# Patient Record
Sex: Male | Born: 1975
Health system: Southern US, Community
[De-identification: ages and names within clinical notes are randomized; demographics above are authoritative.]

## PROBLEM LIST (undated history)

## (undated) DIAGNOSIS — G4730 Sleep apnea, unspecified: Secondary | ICD-10-CM

## (undated) DIAGNOSIS — R0902 Hypoxemia: Secondary | ICD-10-CM

## (undated) DIAGNOSIS — I10 Essential (primary) hypertension: Secondary | ICD-10-CM

## (undated) HISTORY — DX: Essential (primary) hypertension: I10

## (undated) HISTORY — PX: KNEE SURGERY: SHX244

## (undated) HISTORY — DX: Hypoxemia: R09.02

## (undated) HISTORY — DX: Sleep apnea, unspecified: G47.30

## (undated) HISTORY — PX: HERNIA REPAIR: SHX51

---

## 2012-11-30 DIAGNOSIS — E291 Testicular hypofunction: Secondary | ICD-10-CM | POA: Insufficient documentation

## 2012-11-30 DIAGNOSIS — Z79899 Other long term (current) drug therapy: Secondary | ICD-10-CM | POA: Insufficient documentation

## 2013-04-05 DIAGNOSIS — T185XXA Foreign body in anus and rectum, initial encounter: Secondary | ICD-10-CM | POA: Insufficient documentation

## 2013-04-12 DIAGNOSIS — L0390 Cellulitis, unspecified: Secondary | ICD-10-CM | POA: Insufficient documentation

## 2014-05-29 ENCOUNTER — Ambulatory Visit: Payer: Self-pay

## 2014-05-29 ENCOUNTER — Ambulatory Visit: Payer: Self-pay | Admitting: Physician Assistant

## 2014-05-29 LAB — COMPREHENSIVE METABOLIC PANEL
ANION GAP: 9 (ref 7–16)
AST: 37 U/L (ref 15–37)
Albumin: 3 g/dL — ABNORMAL LOW (ref 3.4–5.0)
Alkaline Phosphatase: 71 U/L
BUN: 11 mg/dL (ref 7–18)
Bilirubin,Total: 0.3 mg/dL (ref 0.2–1.0)
CO2: 27 mmol/L (ref 21–32)
Calcium, Total: 8.8 mg/dL (ref 8.5–10.1)
Chloride: 103 mmol/L (ref 98–107)
Creatinine: 0.83 mg/dL (ref 0.60–1.30)
EGFR (African American): >60
EGFR (Non-African Amer.): >60
GLUCOSE: 109 mg/dL — AB (ref 65–99)
OSMOLALITY: 278 (ref 275–301)
POTASSIUM: 4 mmol/L (ref 3.5–5.1)
SGPT (ALT): 54 U/L
Sodium: 139 mmol/L (ref 136–145)
Total Protein: 8.2 g/dL (ref 6.4–8.2)

## 2014-05-29 LAB — CBC WITH DIFFERENTIAL/PLATELET
BASOS ABS: 0.1 10*3/uL (ref 0.0–0.1)
BASOS PCT: 1 %
EOS ABS: 0.2 10*3/uL (ref 0.0–0.7)
EOS PCT: 1.8 %
HCT: 38.4 % — ABNORMAL LOW (ref 40.0–52.0)
HGB: 13.1 g/dL (ref 13.0–18.0)
Lymphocyte #: 2.1 10*3/uL (ref 1.0–3.6)
Lymphocyte %: 20.8 %
MCH: 31.6 pg (ref 26.0–34.0)
MCHC: 34 g/dL (ref 32.0–36.0)
MCV: 93 fL (ref 80–100)
MONO ABS: 0.7 x10 3/mm (ref 0.2–1.0)
Monocyte %: 7.1 %
Neutrophil #: 6.9 10*3/uL — ABNORMAL HIGH (ref 1.4–6.5)
Neutrophil %: 69.3 %
PLATELETS: 202 10*3/uL (ref 150–440)
RBC: 4.13 10*6/uL — AB (ref 4.40–5.90)
RDW: 14 % (ref 11.5–14.5)
WBC: 10 10*3/uL (ref 3.8–10.6)

## 2014-05-29 LAB — D-DIMER(ARMC): D-Dimer: 644 ng/ml

## 2014-06-22 ENCOUNTER — Ambulatory Visit: Payer: Self-pay

## 2014-10-19 ENCOUNTER — Ambulatory Visit: Payer: Self-pay | Admitting: Oncology

## 2014-10-19 LAB — IRON AND TIBC
IRON BIND. CAP.(TOTAL): 265 ug/dL (ref 250–450)
IRON SATURATION: 38 %
IRON: 101 ug/dL (ref 65–175)
UNBOUND IRON-BIND. CAP.: 164 ug/dL

## 2014-10-19 LAB — CBC CANCER CENTER
BASOS ABS: 0.1 x10 3/mm (ref 0.0–0.1)
Basophil %: 0.9 %
Eosinophil #: 0.1 x10 3/mm (ref 0.0–0.7)
Eosinophil %: 1.2 %
HCT: 50.6 % (ref 40.0–52.0)
HGB: 17 g/dL (ref 13.0–18.0)
LYMPHS ABS: 2.6 x10 3/mm (ref 1.0–3.6)
Lymphocyte %: 27.3 %
MCH: 31.4 pg (ref 26.0–34.0)
MCHC: 33.5 g/dL (ref 32.0–36.0)
MCV: 94 fL (ref 80–100)
Monocyte #: 0.6 x10 3/mm (ref 0.2–1.0)
Monocyte %: 6.2 %
Neutrophil #: 6.1 x10 3/mm (ref 1.4–6.5)
Neutrophil %: 64.4 %
PLATELETS: 201 x10 3/mm (ref 150–440)
RBC: 5.4 10*6/uL (ref 4.40–5.90)
RDW: 14.7 % — AB (ref 11.5–14.5)
WBC: 9.5 x10 3/mm (ref 3.8–10.6)

## 2014-10-19 LAB — FERRITIN: Ferritin (ARMC): 337 ng/mL (ref 8–388)

## 2014-11-14 ENCOUNTER — Ambulatory Visit
Admit: 2014-11-14 | Disposition: A | Discharge status: Home / Self Care | Payer: Self-pay | Attending: Oncology | Admitting: Oncology

## 2014-12-15 ENCOUNTER — Ambulatory Visit
Admit: 2014-12-15 | Disposition: A | Discharge status: Home / Self Care | Payer: Self-pay | Attending: Oncology | Admitting: Oncology

## 2015-01-25 ENCOUNTER — Other Ambulatory Visit: Payer: Self-pay

## 2015-01-26 ENCOUNTER — Other Ambulatory Visit: Payer: Self-pay | Admitting: Oncology

## 2015-01-26 ENCOUNTER — Other Ambulatory Visit: Payer: Self-pay

## 2015-01-26 DIAGNOSIS — D751 Secondary polycythemia: Secondary | ICD-10-CM | POA: Insufficient documentation

## 2015-02-22 ENCOUNTER — Ambulatory Visit: Payer: Self-pay | Admitting: Oncology

## 2015-02-22 ENCOUNTER — Inpatient Hospital Stay: Payer: BLUE CROSS/BLUE SHIELD | Attending: Oncology

## 2015-02-22 ENCOUNTER — Other Ambulatory Visit: Payer: BLUE CROSS/BLUE SHIELD

## 2015-03-26 ENCOUNTER — Inpatient Hospital Stay: Payer: BLUE CROSS/BLUE SHIELD | Admitting: Oncology

## 2015-03-26 ENCOUNTER — Inpatient Hospital Stay: Payer: BLUE CROSS/BLUE SHIELD

## 2015-03-26 ENCOUNTER — Inpatient Hospital Stay: Payer: BLUE CROSS/BLUE SHIELD | Attending: Oncology

## 2015-04-11 ENCOUNTER — Inpatient Hospital Stay: Payer: BLUE CROSS/BLUE SHIELD | Admitting: Oncology

## 2015-04-11 ENCOUNTER — Inpatient Hospital Stay: Payer: BLUE CROSS/BLUE SHIELD

## 2015-04-24 ENCOUNTER — Inpatient Hospital Stay: Payer: BLUE CROSS/BLUE SHIELD | Attending: Oncology

## 2015-04-24 ENCOUNTER — Inpatient Hospital Stay: Payer: BLUE CROSS/BLUE SHIELD | Admitting: Oncology

## 2015-04-24 ENCOUNTER — Inpatient Hospital Stay: Payer: BLUE CROSS/BLUE SHIELD

## 2018-03-10 DIAGNOSIS — H5213 Myopia, bilateral: Secondary | ICD-10-CM | POA: Diagnosis not present

## 2018-12-02 DIAGNOSIS — E291 Testicular hypofunction: Secondary | ICD-10-CM | POA: Diagnosis not present

## 2018-12-02 DIAGNOSIS — E785 Hyperlipidemia, unspecified: Secondary | ICD-10-CM | POA: Diagnosis not present

## 2018-12-02 DIAGNOSIS — E039 Hypothyroidism, unspecified: Secondary | ICD-10-CM | POA: Diagnosis not present

## 2018-12-02 DIAGNOSIS — I10 Essential (primary) hypertension: Secondary | ICD-10-CM | POA: Diagnosis not present

## 2018-12-08 DIAGNOSIS — Z23 Encounter for immunization: Secondary | ICD-10-CM | POA: Diagnosis not present

## 2018-12-08 DIAGNOSIS — Z0000 Encounter for general adult medical examination without abnormal findings: Secondary | ICD-10-CM | POA: Diagnosis not present

## 2018-12-08 DIAGNOSIS — E291 Testicular hypofunction: Secondary | ICD-10-CM | POA: Diagnosis not present

## 2018-12-08 DIAGNOSIS — G4730 Sleep apnea, unspecified: Secondary | ICD-10-CM | POA: Diagnosis not present

## 2018-12-08 DIAGNOSIS — I10 Essential (primary) hypertension: Secondary | ICD-10-CM | POA: Diagnosis not present

## 2019-01-14 DIAGNOSIS — E349 Endocrine disorder, unspecified: Secondary | ICD-10-CM | POA: Diagnosis not present

## 2019-01-14 DIAGNOSIS — E785 Hyperlipidemia, unspecified: Secondary | ICD-10-CM | POA: Diagnosis not present

## 2019-01-14 DIAGNOSIS — I10 Essential (primary) hypertension: Secondary | ICD-10-CM | POA: Diagnosis not present

## 2019-01-14 DIAGNOSIS — Z125 Encounter for screening for malignant neoplasm of prostate: Secondary | ICD-10-CM | POA: Diagnosis not present

## 2019-01-14 DIAGNOSIS — D582 Other hemoglobinopathies: Secondary | ICD-10-CM | POA: Diagnosis not present

## 2019-01-14 DIAGNOSIS — R0600 Dyspnea, unspecified: Secondary | ICD-10-CM | POA: Diagnosis not present

## 2019-01-14 DIAGNOSIS — Z8639 Personal history of other endocrine, nutritional and metabolic disease: Secondary | ICD-10-CM | POA: Diagnosis not present

## 2019-01-14 DIAGNOSIS — R600 Localized edema: Secondary | ICD-10-CM | POA: Diagnosis not present

## 2019-01-14 DIAGNOSIS — G4730 Sleep apnea, unspecified: Secondary | ICD-10-CM | POA: Diagnosis not present

## 2019-02-02 DIAGNOSIS — I10 Essential (primary) hypertension: Secondary | ICD-10-CM | POA: Diagnosis not present

## 2019-02-02 DIAGNOSIS — G4730 Sleep apnea, unspecified: Secondary | ICD-10-CM | POA: Diagnosis not present

## 2019-02-02 DIAGNOSIS — E785 Hyperlipidemia, unspecified: Secondary | ICD-10-CM | POA: Diagnosis not present

## 2019-02-16 ENCOUNTER — Other Ambulatory Visit: Payer: Self-pay | Admitting: Internal Medicine

## 2019-02-16 DIAGNOSIS — R600 Localized edema: Secondary | ICD-10-CM

## 2019-02-16 DIAGNOSIS — R0609 Other forms of dyspnea: Secondary | ICD-10-CM

## 2019-02-24 ENCOUNTER — Other Ambulatory Visit: Payer: Self-pay

## 2019-02-24 ENCOUNTER — Ambulatory Visit
Admission: RE | Admit: 2019-02-24 | Discharge: 2019-02-24 | Disposition: A | Discharge status: Home / Self Care | Payer: BC Managed Care – PPO | Source: Ambulatory Visit | Attending: Internal Medicine | Admitting: Internal Medicine

## 2019-02-24 DIAGNOSIS — R600 Localized edema: Secondary | ICD-10-CM | POA: Diagnosis not present

## 2019-02-24 DIAGNOSIS — R0600 Dyspnea, unspecified: Secondary | ICD-10-CM | POA: Diagnosis not present

## 2019-02-24 DIAGNOSIS — R0609 Other forms of dyspnea: Secondary | ICD-10-CM

## 2019-02-24 DIAGNOSIS — I119 Hypertensive heart disease without heart failure: Secondary | ICD-10-CM | POA: Diagnosis not present

## 2019-02-24 DIAGNOSIS — G4733 Obstructive sleep apnea (adult) (pediatric): Secondary | ICD-10-CM | POA: Diagnosis not present

## 2019-02-24 DIAGNOSIS — D582 Other hemoglobinopathies: Secondary | ICD-10-CM | POA: Insufficient documentation

## 2019-02-24 NOTE — Progress Notes (Signed)
*  PRELIMINARY RESULTS* Echocardiogram 2D Echocardiogram has been performed.  [name] Walker 02/24/2019, 10:40 AM

## 2019-02-25 ENCOUNTER — Other Ambulatory Visit: Payer: Self-pay | Admitting: Physician Assistant

## 2019-02-25 ENCOUNTER — Other Ambulatory Visit (HOSPITAL_COMMUNITY): Payer: Self-pay | Admitting: Physician Assistant

## 2019-02-25 ENCOUNTER — Ambulatory Visit
Admission: RE | Admit: 2019-02-25 | Discharge: 2019-02-25 | Disposition: A | Discharge status: Home / Self Care | Payer: BC Managed Care – PPO | Source: Ambulatory Visit | Attending: Physician Assistant | Admitting: Physician Assistant

## 2019-02-25 DIAGNOSIS — I872 Venous insufficiency (chronic) (peripheral): Secondary | ICD-10-CM | POA: Diagnosis not present

## 2019-02-25 DIAGNOSIS — R600 Localized edema: Secondary | ICD-10-CM | POA: Diagnosis not present

## 2019-02-25 DIAGNOSIS — L03115 Cellulitis of right lower limb: Secondary | ICD-10-CM | POA: Diagnosis not present

## 2019-02-25 DIAGNOSIS — M7989 Other specified soft tissue disorders: Secondary | ICD-10-CM | POA: Insufficient documentation

## 2019-02-28 ENCOUNTER — Telehealth (INDEPENDENT_AMBULATORY_CARE_PROVIDER_SITE_OTHER): Payer: Self-pay

## 2019-02-28 DIAGNOSIS — L03115 Cellulitis of right lower limb: Secondary | ICD-10-CM | POA: Diagnosis not present

## 2019-02-28 NOTE — Telephone Encounter (Signed)
[name] Walker (P.A.) had called having a question about the patient care.The patient has cellulitis and is on antibiotics with unna boots being placed and the patient right calf skin is sloughing.[name] Walker (P.A) asked if we did debridement or wound care in the office if not he will refer the patient to wound center.I informed him we do not do debridement in the office if so it will be done in the hospital,so the patient will be referred to wound center but will be keeping appointment with Dr Lucky Cowboy on 03/04/2019.

## 2019-03-02 ENCOUNTER — Encounter: Payer: BC Managed Care – PPO | Attending: Internal Medicine | Admitting: Internal Medicine

## 2019-03-02 ENCOUNTER — Other Ambulatory Visit: Payer: Self-pay

## 2019-03-02 DIAGNOSIS — Z809 Family history of malignant neoplasm, unspecified: Secondary | ICD-10-CM | POA: Insufficient documentation

## 2019-03-02 DIAGNOSIS — I87311 Chronic venous hypertension (idiopathic) with ulcer of right lower extremity: Secondary | ICD-10-CM | POA: Diagnosis not present

## 2019-03-02 DIAGNOSIS — I890 Lymphedema, not elsewhere classified: Secondary | ICD-10-CM | POA: Insufficient documentation

## 2019-03-02 DIAGNOSIS — I872 Venous insufficiency (chronic) (peripheral): Secondary | ICD-10-CM | POA: Insufficient documentation

## 2019-03-02 DIAGNOSIS — Z8249 Family history of ischemic heart disease and other diseases of the circulatory system: Secondary | ICD-10-CM | POA: Insufficient documentation

## 2019-03-02 DIAGNOSIS — Z8349 Family history of other endocrine, nutritional and metabolic diseases: Secondary | ICD-10-CM | POA: Diagnosis not present

## 2019-03-02 DIAGNOSIS — L03115 Cellulitis of right lower limb: Secondary | ICD-10-CM | POA: Diagnosis not present

## 2019-03-02 DIAGNOSIS — L97211 Non-pressure chronic ulcer of right calf limited to breakdown of skin: Secondary | ICD-10-CM | POA: Insufficient documentation

## 2019-03-02 DIAGNOSIS — S81801A Unspecified open wound, right lower leg, initial encounter: Secondary | ICD-10-CM | POA: Diagnosis not present

## 2019-03-02 DIAGNOSIS — G4733 Obstructive sleep apnea (adult) (pediatric): Secondary | ICD-10-CM | POA: Diagnosis not present

## 2019-03-02 DIAGNOSIS — Z880 Allergy status to penicillin: Secondary | ICD-10-CM | POA: Insufficient documentation

## 2019-03-02 DIAGNOSIS — E349 Endocrine disorder, unspecified: Secondary | ICD-10-CM | POA: Diagnosis not present

## 2019-03-02 DIAGNOSIS — I10 Essential (primary) hypertension: Secondary | ICD-10-CM | POA: Diagnosis not present

## 2019-03-02 DIAGNOSIS — Z881 Allergy status to other antibiotic agents status: Secondary | ICD-10-CM | POA: Insufficient documentation

## 2019-03-02 DIAGNOSIS — Z6841 Body Mass Index (BMI) 40.0 and over, adult: Secondary | ICD-10-CM | POA: Insufficient documentation

## 2019-03-03 ENCOUNTER — Other Ambulatory Visit
Admission: RE | Admit: 2019-03-03 | Discharge: 2019-03-03 | Disposition: A | Discharge status: Home / Self Care | Payer: BC Managed Care – PPO | Source: Ambulatory Visit | Attending: Internal Medicine | Admitting: Internal Medicine

## 2019-03-03 DIAGNOSIS — L97211 Non-pressure chronic ulcer of right calf limited to breakdown of skin: Secondary | ICD-10-CM | POA: Diagnosis not present

## 2019-03-03 DIAGNOSIS — L03115 Cellulitis of right lower limb: Secondary | ICD-10-CM | POA: Insufficient documentation

## 2019-03-03 NOTE — Progress Notes (Signed)
[name] Walker, Akiel ([id number]) Visit Report for 03/02/2019 Abuse/Suicide Risk Screen Details Patient Name: [name] Walker, [name] Walker Date of Service: 03/02/2019 2:00 PM Medical Record Number: [id number] Patient Account Number: [id number] Date of Birth/Sex: July 11, 1976 (43 y.o. M) Treating RN: Arnette NorrisBiell, Kristina Primary Care [name] Walker: Daniel NonesKLEIN, BERT Other Clinician: Referring Cashe Gatt: Ignacia Bayleyumey, Robert Treating Corneisha Alvi/Extender: Altamese CarolinaOBSON, MICHAEL G Weeks in Treatment: 0 Abuse/Suicide Risk Screen Items Answer ABUSE RISK SCREEN: Has anyone close to you tried to hurt or harm you recentlyo No Do you feel uncomfortable with anyone in your familyo No Has anyone forced you do things that you didnot want to doo No Electronic Signature(s) Signed: 03/02/2019 4:39:36 PM By: Arnette NorrisBiell, Kristina Entered By: Arnette NorrisBiell, Kristina on 03/02/2019 14:17:15 Moccio, Jashawn ([id number]) -------------------------------------------------------------------------------- Activities of Daily Living Details Patient Name: [name] Walker, [name] Walker Date of Service: 03/02/2019 2:00 PM Medical Record Number: [id number] Patient Account Number: [id number] Date of Birth/Sex: July 11, 1976 ([age] y.o. M) Treating RN: Arnette NorrisBiell, Kristina Primary Care Sparsh Callens: Daniel NonesKLEIN, BERT Other Clinician: Referring Romyn Boswell: Ignacia Bayleyumey, Robert Treating Biagio Snelson/Extender: Altamese CarolinaOBSON, MICHAEL G Weeks in Treatment: 0 Activities of Daily Living Items Answer Activities of Daily Living (Please select one for each item) Drive Automobile Completely Able Take Medications Completely Able Use Telephone Completely Able Care for Appearance Completely Able Use Toilet Completely Able Bath / Shower Completely Able Dress Self Completely Able Feed Self Completely Able Walk Completely Able Get In / Out Bed Completely Able Housework Completely Able Prepare Meals Completely Able Handle Money Completely Able Shop for Self Completely Able Electronic Signature(s) Signed: 03/02/2019 4:39:36 PM By: Arnette NorrisBiell, Kristina Entered By:  Arnette NorrisBiell, Kristina on 03/02/2019 14:17:28 Knauer, Blakeley ([id number]) -------------------------------------------------------------------------------- Education Screening Details Patient Name: [name] Walker, [name] Walker Date of Service: 03/02/2019 2:00 PM Medical Record Number: [id number] Patient Account Number: [id number] Date of Birth/Sex: July 11, 1976 (43 y.o. M) Treating RN: Arnette NorrisBiell, Kristina Primary Care Sarahbeth Cashin: Daniel NonesKLEIN, BERT Other Clinician: Referring Aleen Marston: Ignacia Bayleyumey, Robert Treating Nicoles Sedlacek/Extender: Altamese CarolinaOBSON, MICHAEL G Weeks in Treatment: 0 Primary Learner Assessed: Patient Learning Preferences/Education Level/Primary Language Learning Preference: Explanation Highest Education Level: High School Preferred Language: English Cognitive Barrier Language Barrier: No Translator Needed: No Memory Deficit: No Emotional Barrier: No Cultural/Religious Beliefs Affecting Medical Care: No Physical Barrier Impaired Vision: No Impaired Hearing: No Decreased Hand dexterity: No Knowledge/Comprehension Knowledge Level: High Comprehension Level: High Ability to understand written High instructions: Ability to understand verbal High instructions: Motivation Anxiety Level: Calm Cooperation: Cooperative Education Importance: Acknowledges Need Interest in Health Problems: Asks Questions Perception: Coherent Willingness to Engage in Self- High Management Activities: Readiness to Engage in Self- High Management Activities: Electronic Signature(s) Signed: 03/02/2019 4:39:36 PM By: Arnette NorrisBiell, Kristina Entered By: Arnette NorrisBiell, Kristina on 03/02/2019 14:17:54 Fullen, Rodgers ([id number]) -------------------------------------------------------------------------------- Fall Risk Assessment Details Patient Name: [name] Walker, [name] Walker Date of Service: 03/02/2019 2:00 PM Medical Record Number: [id number] Patient Account Number: [id number] Date of Birth/Sex: July 11, 1976 (43 y.o. M) Treating RN: Arnette NorrisBiell, Kristina Primary Care Ocean Schildt:  Daniel NonesKLEIN, BERT Other Clinician: Referring Reyn Faivre: Ignacia Bayleyumey, Robert Treating Gianny Killman/Extender: Altamese CarolinaOBSON, MICHAEL G Weeks in Treatment: 0 Fall Risk Assessment Items Have you had 2 or more falls in the last 12 monthso 0 Yes Have you had any fall that resulted in injury in the last 12 monthso 0 Yes FALLS RISK SCREEN History of falling - immediate or within 3 months 25 Yes Secondary diagnosis (Do you have 2 or more medical diagnoseso) 0 No Ambulatory aid None/bed rest/wheelchair/nurse 0 No Crutches/cane/walker 0 No Furniture 0 No Intravenous therapy Access/Saline/Heparin Lock 0 No Gait/Transferring Normal/ bed rest/ wheelchair 0 No Weak (short steps with or without shuffle, stooped but able  to lift head while 0 No walking, may seek support from furniture) Impaired (short steps with shuffle, may have difficulty arising from chair, head 0 No down, impaired balance) Mental Status Oriented to own ability 0 Yes Electronic Signature(s) Signed: 03/02/2019 4:39:36 PM By: Harold Barban Entered By: Harold Barban on 03/02/2019 14:18:29 Solinger, Shiheem ([id number]) -------------------------------------------------------------------------------- Foot Assessment Details Patient Name: [name] Walker, [name] Walker Date of Service: 03/02/2019 2:00 PM Medical Record Number: [id number] Patient Account Number: [id number] Date of Birth/Sex: [birth year] (43 y.o. M) Treating RN: Harold Barban Primary Care Leaann Nevils: Ramonita Lab Other Clinician: Referring Jorah Hua: Mortimer Fries Treating Aliene Tamura/Extender: Tito Dine in Treatment: 0 Foot Assessment Items Site Locations + = Sensation present, - = Sensation absent, C = Callus, U = Ulcer R = Redness, W = Warmth, M = Maceration, PU = Pre-ulcerative lesion F = Fissure, S = Swelling, D = Dryness Assessment Right: Left: Other Deformity: No No Prior Foot Ulcer: No No Prior Amputation: No No Charcot Joint: No No Ambulatory Status: Ambulatory Without Help Gait:  Steady Electronic Signature(s) Signed: 03/02/2019 4:39:36 PM By: Harold Barban Entered By: Harold Barban on 03/02/2019 14:21:34 Good [place], Kortland ([id number]) -------------------------------------------------------------------------------- Nutrition Risk Screening Details Patient Name: [name] Walker, [name] Walker Date of Service: 03/02/2019 2:00 PM Medical Record Number: [id number] Patient Account Number: [id number] Date of Birth/Sex: 30-May-1976 (43 y.o. M) Treating RN: Harold Barban Primary Care Ainsley Sanguinetti: Ramonita Lab Other Clinician: Referring Tobe Kervin: Mortimer Fries Treating Keyaira Clapham/Extender: Tito Dine in Treatment: 0 Height (in): 66 Weight (lbs): 442 Body Mass Index (BMI): 71.3 Nutrition Risk Screening Items Score Screening NUTRITION RISK SCREEN: I have an illness or condition that made me change the kind and/or amount of 0 No food I eat I eat fewer than two meals per day 0 No I eat few fruits and vegetables, or milk products 0 No I have three or more drinks of beer, liquor or wine almost every day 0 No I have tooth or mouth problems that make it hard for me to eat 0 No I don't always have enough money to buy the food I need 0 No I eat alone most of the time 0 No I take three or more different prescribed or over-the-counter drugs a day 1 Yes Without wanting to, I have lost or gained 10 pounds in the last six months 0 No I am not always physically able to shop, cook and/or feed myself 0 No Nutrition Protocols Good Risk Protocol Moderate Risk Protocol High Risk Proctocol Risk Level: Good Risk Score: 1 Electronic Signature(s) Signed: 03/02/2019 4:39:36 PM By: Harold Barban Entered By: Harold Barban on 03/02/2019 14:18:49

## 2019-03-03 NOTE — Progress Notes (Addendum)
[name] Walker, Orlyn ([id number]) Visit Report for 03/02/2019 Chief Complaint Document Details Patient Name: [name] Walker, Berl Date of Service: 03/02/2019 2:00 PM Medical Record Number: [id number] Patient Account Number: [id number] Date of Birth/Sex: Jan 24, 1976 (43 y.o. M) Treating RN: Huel CoventryWoody, Kim Primary Care Provider: Daniel NonesKLEIN, BERT Other Clinician: Referring Provider: Daniel NonesKLEIN, BERT Treating Provider/Extender: Altamese CarolinaOBSON, Ordell Prichett G Weeks in Treatment: 0 Information Obtained from: Patient Chief Complaint 03/02/2019; patient arrives here for review of a wound on the posterior right calf in the setting of severe cellulitis of the right leg Electronic Signature(s) Signed: 03/02/2019 6:03:55 PM By: Baltazar Najjarobson, Jaun Galluzzo MD Entered By: Baltazar Najjarobson, Omari Koslosky on 03/02/2019 15:36:09 [place], Aquila ([id number]) -------------------------------------------------------------------------------- Debridement Details Patient Name: [name] Walker, [name] Walker Date of Service: 03/02/2019 2:00 PM Medical Record Number: [id number] Patient Account Number: [id number] Date of Birth/Sex: Jan 24, 1976 (43 y.o. M) Treating RN: Huel CoventryWoody, Kim Primary Care Provider: Daniel NonesKLEIN, BERT Other Clinician: Referring Provider: Daniel NonesKLEIN, BERT Treating Provider/Extender: Altamese CarolinaOBSON, Chelsea Pedretti G Weeks in Treatment: 0 Debridement Performed for Wound #1 Right,Posterior Lower Leg Assessment: Performed By: Physician Maxwell CaulOBSON, Ulysses Alper G, MD Debridement Type: Chemical/Enzymatic/Mechanical Agent Used: Gauze and saline Severity of Tissue Pre Fat layer exposed Debridement: Level of Consciousness (Pre- Awake and Alert procedure): Pre-procedure Verification/Time Yes - 15:00 Out Taken: Start Time: 15:00 Pain Control: Lidocaine Instrument: Other : Saline and gauze Bleeding: Minimum Hemostasis Achieved: Pressure End Time: 15:05 Response to Treatment: Procedure was tolerated well Level of Consciousness Awake and Alert (Post-procedure): Post Debridement Measurements of Total Wound Length: (cm)  19 Width: (cm) 24 Depth: (cm) 0.1 Volume: (cm) 35.814 Character of Wound/Ulcer Post Debridement: Stable Severity of Tissue Post Debridement: Limited to breakdown of skin Post Procedure Diagnosis Same as Pre-procedure Electronic Signature(s) Signed: 03/02/2019 6:03:55 PM By: Baltazar Najjarobson, Bryanah Sidell MD Signed: 03/03/2019 4:58:07 PM By: Elliot GurneyWoody, BSN, RN, CWS, Kim RN, BSN Entered By: Baltazar Najjarobson, Abdulahi Schor on 03/02/2019 15:35:46 Loy, Sayge ([id number]) -------------------------------------------------------------------------------- HPI Details Patient Name: [name] Walker, [name] Walker Date of Service: 03/02/2019 2:00 PM Medical Record Number: [id number] Patient Account Number: [id number] Date of Birth/Sex: Jan 24, 1976 (43 y.o. M) Treating RN: Huel CoventryWoody, Kim Primary Care Provider: Daniel NonesKLEIN, BERT Other Clinician: Referring Provider: Daniel NonesKLEIN, BERT Treating Provider/Extender: Altamese CarolinaOBSON, Nohemi Nicklaus G Weeks in Treatment: 0 History of Present Illness HPI Description: ADMISSION 03/02/2019 This is a 43 year old man who is not a diabetic. He states he was well up until 8 days ago when he felt fatigued and generally unwell while at work. He went home and discovered intense erythema in the right leg. This was painful. He did not make it into see his doctor until 3 days later. He was put on doxycycline. He had a duplex ultrasound done at interventional radiology that did not show reflux, DVT or superficial thrombophlebitis. He was put on doxycycline which she is still taking. He was put in an Radio broadcast assistantUnna boot. He was reviewed again 2 days ago and referred here. He had an Radio broadcast assistantUnna boot on when he came in here today. Patient states he is not systemically unwell but the leg is uncomfortable. He does not have a history of wounds on his legs. He does have some discoloration of the left leg which looks like chronic venous changes however he states that the right leg did not have this prior to any changes recently. I note that he has been seen by Dr. Graciela HusbandsKlein at the  [facility] worked up for lower extremity edema. He had an echocardiogram. Past medical history includes hypertension, edema of both legs, obstructive sleep apnea, morbid obesity. Allergies, he is listed as being allergic to amoxicillin from 2015 and  his primary doctor's office although I do not see this in epic. The patient does not remember anything about this. He does not remember being on amoxicillin. Socially; he works in a Glass blower/designer. But he denies getting his leg into contact with anything particularly problematic ABIs in our clinic were noncompressible bilaterally Electronic Signature(s) Signed: 03/02/2019 6:03:55 PM By: Linton Ham MD Entered By: Linton Ham on 03/02/2019 15:39:44 Cavan, Dajohn ([id number]) -------------------------------------------------------------------------------- Physical Exam Details Patient Name: [name] Walker, [name] Walker Date of Service: 03/02/2019 2:00 PM Medical Record Number: [id number] Patient Account Number: [id number] Date of Birth/Sex: November 04, 1975 (43 y.o. M) Treating RN: Cornell Barman Primary Care Provider: Ramonita Lab Other Clinician: Referring Provider: Ramonita Lab Treating Provider/Extender: Ricard Dillon Weeks in Treatment: 0 Constitutional Patient is hypertensive.. Pulse regular and within target range for patient.Marland Kitchen Respirations regular, non-labored and within target range.. Temperature is normal and within the target range for the patient.Marland Kitchen appears in no distress. Morbid obesity. Eyes Conjunctivae clear. No discharge. Respiratory Respiratory effort is easy and symmetric bilaterally. Rate is normal at rest and on room air.. Bilateral breath sounds are clear and equal in all lobes with no wheezes, rales or rhonchi.. Cardiovascular Heart rhythm and rate regular, without murmur or gallop.Marland Kitchen Lymphatic None palpable in the popliteal area. Integumentary (Hair, Skin) There is some changes of chronic venous insufficiency in the left  leg. Psychiatric No evidence of depression, anxiety, or agitation. Calm, cooperative, and communicative. Appropriate interactions and affect.. Notes Wound exam; area questions on a large area of his posterior right calf. There is intense superficial tissue destruction with necrotic peeling skin on the large area of this wound. I thought there was a blister inferiorly and using scissors I attempted to open that to get a culture this turned out to be more necrotic tissue however I did culture this area. The rest of the leg is intensely erythematous and very painful. The extent of this was concerning. Electronic Signature(s) Signed: 03/02/2019 6:03:55 PM By: Linton Ham MD Entered By: Linton Ham on 03/02/2019 15:41:52 [place], Cree ([id number]) -------------------------------------------------------------------------------- Physician Orders Details Patient Name: [name] Walker, Idrees Date of Service: 03/02/2019 2:00 PM Medical Record Number: [id number] Patient Account Number: [id number] Date of Birth/Sex: [birth year] (43 y.o. M) Treating RN: Cornell Barman Primary Care Provider: Ramonita Lab Other Clinician: Referring Provider: Ramonita Lab Treating Provider/Extender: Tito Dine in Treatment: 0 Verbal / Phone Orders: No Diagnosis Coding Wound Cleansing Wound #1 Right,Posterior Lower Leg o Dial antibacterial soap, wash wounds, rinse and pat dry prior to dressing wounds o May shower with protection. Anesthetic (add to Medication List) Wound #1 Right,Posterior Lower Leg o Topical Lidocaine 4% cream applied to wound bed prior to debridement (In Clinic Only). Primary Wound Dressing Wound #1 Right,Posterior Lower Leg o Silver Alginate Secondary Dressing Wound #1 Right,Posterior Lower Leg o ABD pad Dressing Change Frequency Wound #1 Right,Posterior Lower Leg o Change dressing every day. Follow-up Appointments Wound #1 Right,Posterior Lower Leg o Return Appointment in 1  week. o Return Appointment in: - Friday 04/03/2019 for re-check Edema Control Wound #1 Right,Posterior Lower Leg o Elevate legs to the level of the heart and pump ankles as often as possible Additional Orders / Instructions o Other: - Out of work until the 24th. Will re-evaluate at hat time. Medications-please add to medication list. Wound #1 Right,Posterior Lower Leg o P.O. Antibiotics Laboratory o Bacteria identified in Wound by Culture (MICRO) oooo LOINC Code: 3536-1 Mundt, Jeff ([id number]) oooo Convenience Name: Wound culture routine Patient Medications Allergies: amoxicillin,  Maxzide Notifications Medication Indication Start End linezolid cellulitis right leg 03/02/2019 DOSE oral 600 mg tablet - 1 tablet oral bid for 7 days Notes Redness marked-if redness increases, spreads, pain worsens or fever arises, go immediately to an emergency department. Electronic Signature(s) Signed: 03/07/2019 4:23:08 PM By: Elliot Gurney, BSN, RN, CWS, Kim RN, BSN Signed: 03/09/2019 4:13:17 PM By: Baltazar Najjar MD Previous Signature: 03/02/2019 6:03:55 PM Version By: Baltazar Najjar MD Previous Signature: 03/03/2019 4:58:07 PM Version By: Elliot Gurney BSN, RN, CWS, Kim RN, BSN Previous Signature: 03/02/2019 3:14:23 PM Version By: Baltazar Najjar MD Entered By: Elliot Gurney BSN, RN, CWS, Kim on 03/04/2019 12:49:18 Dishman, Calin ([id number]) -------------------------------------------------------------------------------- Problem List Details Patient Name: RIDEAUX, Nishawn Date of Service: 03/02/2019 2:00 PM Medical Record Number: [id number] Patient Account Number: [id number] Date of Birth/Sex: 1976-09-13 (43 y.o. M) Treating RN: Huel Coventry Primary Care Provider: Daniel Nones Other Clinician: Referring Provider: Daniel Nones Treating Provider/Extender: Altamese [place] in Treatment: 0 Active Problems ICD-10 Evaluated Encounter Code Description Active Date Today Diagnosis L97.211 Non-pressure chronic ulcer  of right calf limited to breakdown 03/02/2019 No Yes of skin L03.115 Cellulitis of right lower limb 03/02/2019 No Yes I87.311 Chronic venous hypertension (idiopathic) with ulcer of right 03/02/2019 No Yes lower extremity Inactive Problems Resolved Problems Electronic Signature(s) Signed: 03/02/2019 6:03:55 PM By: Baltazar Najjar MD Entered By: Baltazar Najjar on 03/02/2019 15:34:01 Kresse, Kier ([id number]) -------------------------------------------------------------------------------- Progress Note Details Patient Name: [name] Repress, Sammy Date of Service: 03/02/2019 2:00 PM Medical Record Number: [id number] Patient Account Number: [id number] Date of Birth/Sex: 12-17-1975 (43 y.o. M) Treating RN: Huel Coventry Primary Care Provider: Daniel Nones Other Clinician: Referring Provider: Daniel Nones Treating Provider/Extender: Altamese [place] in Treatment: 0 Subjective Chief Complaint Information obtained from Patient 03/02/2019; patient arrives here for review of a wound on the posterior right calf in the setting of severe cellulitis of the right leg History of Present Illness (HPI) ADMISSION 03/02/2019 This is a 43 year old man who is not a diabetic. He states he was well up until 8 days ago when he felt fatigued and generally unwell while at work. He went home and discovered intense erythema in the right leg. This was painful. He did not make it into see his doctor until 3 days later. He was put on doxycycline. He had a duplex ultrasound done at interventional radiology that did not show reflux, DVT or superficial thrombophlebitis. He was put on doxycycline which she is still taking. He was put in an Radio broadcast assistant. He was reviewed again 2 days ago and referred here. He had an Radio broadcast assistant on when he came in here today. Patient states he is not systemically unwell but the leg is uncomfortable. He does not have a history of wounds on his legs. He does have some discoloration of the left leg which looks  like chronic venous changes however he states that the right leg did not have this prior to any changes recently. I note that he has been seen by Dr. Graciela Husbands at the [facility] worked up for lower extremity edema. He had an echocardiogram. Past medical history includes hypertension, edema of both legs, obstructive sleep apnea, morbid obesity. Allergies, he is listed as being allergic to amoxicillin from 2015 and his primary doctor's office although I do not see this in epic. The patient does not remember anything about this. He does not remember being on amoxicillin. Socially; he works in a Education officer, community. But he denies getting his leg into contact with anything particularly problematic  ABIs in our clinic were noncompressible bilaterally Patient History Information obtained from Patient. Allergies amoxicillin (Reaction: unknown), Maxzide (Reaction: unknown) Family History Cancer - Maternal Grandparents, Diabetes - Mother,Father, Heart Disease - Father, Hypertension - Father,Mother, Thyroid Problems - Mother, No family history of Hereditary Spherocytosis, Kidney Disease, Lung Disease, Seizures, Stroke, Tuberculosis. Social History Never smoker, Alcohol Use - Never, Drug Use - No History, Caffeine Use - Daily. Medical History Eyes Denies history of Cataracts, Glaucoma, Optic Neuritis Grinage, Jeovany ([id number]) Ear/Nose/Mouth/Throat Denies history of Chronic sinus problems/congestion, Middle ear problems Hematologic/Lymphatic Denies history of Anemia, Hemophilia, Human Immunodeficiency Virus, Lymphedema, Sickle Cell Disease Cardiovascular Patient has history of Hypertension Denies history of Angina, Arrhythmia, Congestive Heart Failure, Coronary Artery Disease, Deep Vein Thrombosis, Hypotension, Myocardial Infarction, Peripheral Arterial Disease, Peripheral Venous Disease, Phlebitis, Vasculitis Gastrointestinal Denies history of Cirrhosis , Colitis, Crohn s, Hepatitis A,  Hepatitis B, Hepatitis C Endocrine Denies history of Type I Diabetes, Type II Diabetes Genitourinary Denies history of End Stage Renal Disease Immunological Denies history of Lupus Erythematosus, Raynaud s, Scleroderma Integumentary (Skin) Denies history of History of Burn, History of pressure wounds Musculoskeletal Denies history of Gout, Rheumatoid Arthritis, Osteoarthritis, Osteomyelitis Oncologic Denies history of Received Chemotherapy, Received Radiation Psychiatric Denies history of Anorexia/bulimia, Confinement Anxiety Medical And Surgical History Notes Respiratory Sleep apnea on CPAP Review of Systems (ROS) Constitutional Symptoms (General Health) Denies complaints or symptoms of Fatigue, Fever, Chills, Marked Weight Change. Eyes Denies complaints or symptoms of Dry Eyes, Vision Changes, Glasses / Contacts. Ear/Nose/Mouth/Throat Denies complaints or symptoms of Difficult clearing ears, Sinusitis. Hematologic/Lymphatic Denies complaints or symptoms of Bleeding / Clotting Disorders, Human Immunodeficiency Virus. Respiratory Denies complaints or symptoms of Chronic or frequent coughs, Shortness of Breath. Cardiovascular Denies complaints or symptoms of Chest pain, LE edema. Gastrointestinal Denies complaints or symptoms of Frequent diarrhea, Nausea, Vomiting. Endocrine Complains or has symptoms of Thyroid disease - Hypothyroidism- past, hypogonadism Genitourinary Denies complaints or symptoms of Kidney failure/ Dialysis, Incontinence/dribbling. Immunological Denies complaints or symptoms of Hives, Itching. Integumentary (Skin) Complains or has symptoms of Breakdown, Swelling. Denies complaints or symptoms of Wounds, Bleeding or bruising tendency. Musculoskeletal Denies complaints or symptoms of Muscle Pain, Muscle Weakness. Psychiatric Denies complaints or symptoms of Anxiety, Claustrophobia. Natzke, Davante ([id number]) Objective Constitutional Patient is  hypertensive.. Pulse regular and within target range for patient.Marland Kitchen. Respirations regular, non-labored and within target range.. Temperature is normal and within the target range for the patient.Marland Kitchen. appears in no distress. Morbid obesity. Vitals Time Taken: 2:00 PM, Height: 66 in, Source: Stated, Weight: 442 lbs, Source: Stated, BMI: 71.3, Temperature: 98.2 F, Pulse: 90 bpm, Respiratory Rate: 18 breaths/min, Blood Pressure: 175/80 mmHg. Eyes Conjunctivae clear. No discharge. Respiratory Respiratory effort is easy and symmetric bilaterally. Rate is normal at rest and on room air.. Bilateral breath sounds are clear and equal in all lobes with no wheezes, rales or rhonchi.. Cardiovascular Heart rhythm and rate regular, without murmur or gallop.Marland Kitchen. Lymphatic None palpable in the popliteal area. Psychiatric No evidence of depression, anxiety, or agitation. Calm, cooperative, and communicative. Appropriate interactions and affect.. General Notes: Wound exam; area questions on a large area of his posterior right calf. There is intense superficial tissue destruction with necrotic peeling skin on the large area of this wound. I thought there was a blister inferiorly and using scissors I attempted to open that to get a culture this turned out to be more necrotic tissue however I did culture this area. The rest of the leg is intensely erythematous and very painful.  The extent of this was concerning. Integumentary (Hair, Skin) There is some changes of chronic venous insufficiency in the left leg. Wound #1 status is Open. Original cause of wound was Gradually Appeared. The wound is located on the Right,Posterior Lower Leg. The wound measures 19cm length x 24cm width x 0.1cm depth; 358.142cm^2 area and 35.814cm^3 volume. There is Fat Layer (Subcutaneous Tissue) Exposed exposed. There is no tunneling or undermining noted. There is a large amount of serous drainage noted. The wound margin is flat and intact.  There is small (1-33%) pale granulation within the wound bed. There is a large (67-100%) amount of necrotic tissue within the wound bed including Adherent Slough. Assessment Active Problems ICD-10 Non-pressure chronic ulcer of right calf limited to breakdown of skin Cellulitis of right lower limb Chronic venous hypertension (idiopathic) with ulcer of right lower extremity Crall, Cinch ([id number]) Procedures Wound #1 Pre-procedure diagnosis of Wound #1 is a Lymphedema located on the Right,Posterior Lower Leg .Severity of Tissue Pre Debridement is: Fat layer exposed. There was a Chemical/Enzymatic/Mechanical debridement performed by Maxwell CaulOBSON, Kemonte Ullman G, MD. With the following instrument(s): Saline and gauze after achieving pain control using Lidocaine. Other agent used was Gauze and saline. A time out was conducted at 15:00, prior to the start of the procedure. A Minimum amount of bleeding was controlled with Pressure. The procedure was tolerated well. Post Debridement Measurements: 19cm length x 24cm width x 0.1cm depth; 35.814cm^3 volume. Character of Wound/Ulcer Post Debridement is stable. Severity of Tissue Post Debridement is: Limited to breakdown of skin. Post procedure Diagnosis Wound #1: Same as Pre-Procedure Plan Wound Cleansing: Wound #1 Right,Posterior Lower Leg: Dial antibacterial soap, wash wounds, rinse and pat dry prior to dressing wounds May shower with protection. Anesthetic (add to Medication List): Wound #1 Right,Posterior Lower Leg: Topical Lidocaine 4% cream applied to wound bed prior to debridement (In Clinic Only). Primary Wound Dressing: Wound #1 Right,Posterior Lower Leg: Silver Alginate Secondary Dressing: Wound #1 Right,Posterior Lower Leg: ABD pad Dressing Change Frequency: Wound #1 Right,Posterior Lower Leg: Change dressing every day. Follow-up Appointments: Wound #1 Right,Posterior Lower Leg: Return Appointment in 1 week. Return Appointment in: -  Friday 04/03/2019 for re-check Edema Control: Wound #1 Right,Posterior Lower Leg: Elevate legs to the level of the heart and pump ankles as often as possible Additional Orders / Instructions: Other: - Out of work until the 24th. Will re-evaluate at hat time. Medications-please add to medication list.: Wound #1 Right,Posterior Lower Leg: P.O. Antibiotics The following medication(s) was prescribed: linezolid oral 600 mg tablet 1 tablet oral bid for 7 days for cellulitis right leg starting 03/02/2019 General Notes: Redness marked-if redness increases, spreads, pain worsens or fever arises, go immediately [place] ([id number]) emergency department. 1. This patient has intense lower extremity cellulitis with fire engine red erythema and tenderness. This went up to the level of his tibial tuberosity anteriorly. I have marked the upper areas of this 2. He is not responding to doxycycline or at least not responding well. It is worthwhile remembering that doxycycline is not the treatment of choice for strep infections and this is likely strep. I have wanted to give him Keflex however he is allergic to amoxicillin although he remembers nothing about this. I ultimately gave him linezolid 600 twice daily for 7 days hopefully this will prove to be affordable. 3. I actually suggested initially that this patient go to the hospital for 48 hours of IV antibiotics given the extent of this. He refused. We told  him that the possibility of spreading infection or sepsis is possible he still wanted to try another round of different oral antibiotics. I have urged him to go to the hospital if he becomes systemically unwell for the erythema that I have marked in his leg worsens. 4. He had a duplex ultrasound that was negative for DVT but truthfully this does not look like a DVT 5. We put silver alginate on the pack of the wound with ABDs Kerlix wrap and Tubigrip. We have asked him to change this daily. We  will see him back on Friday in follow-up to make sure the leg is responding. 6. I have asked him to call his primary doctor to check on this amoxicillin allergy. If he is truly amoxicillin allergic he has about a 7% chance of coexistent cephalosporin allergy. I am actually doubtful he is amoxicillin allergic Electronic Signature(s) Signed: 03/02/2019 6:03:55 PM By: Baltazar Najjar MD Entered By: Baltazar Najjar on 03/02/2019 15:44:50 Crass, Tre ([id number]) -------------------------------------------------------------------------------- ROS/PFSH Details Patient Name: [name] Repress, Adolfo Date of Service: 03/02/2019 2:00 PM Medical Record Number: [id number] Patient Account Number: [id number] Date of Birth/Sex: 07/22/1976 (43 y.o. M) Treating RN: Arnette Norris Primary Care Provider: Daniel Nones Other Clinician: Referring Provider: Daniel Nones Treating Provider/Extender: Altamese [place] in Treatment: 0 Information Obtained From Patient Constitutional Symptoms (General Health) Complaints and Symptoms: Negative for: Fatigue; Fever; Chills; Marked Weight Change Eyes Complaints and Symptoms: Negative for: Dry Eyes; Vision Changes; Glasses / Contacts Medical History: Negative for: Cataracts; Glaucoma; Optic Neuritis Ear/Nose/Mouth/Throat Complaints and Symptoms: Negative for: Difficult clearing ears; Sinusitis Medical History: Negative for: Chronic sinus problems/congestion; Middle ear problems Hematologic/Lymphatic Complaints and Symptoms: Negative for: Bleeding / Clotting Disorders; Human Immunodeficiency Virus Medical History: Negative for: Anemia; Hemophilia; Human Immunodeficiency Virus; Lymphedema; Sickle Cell Disease Respiratory Complaints and Symptoms: Negative for: Chronic or frequent coughs; Shortness of Breath Medical History: Past Medical History Notes: Sleep apnea on CPAP Cardiovascular Complaints and Symptoms: Negative for: Chest pain; LE edema Medical  History: Positive for: Hypertension Negative for: Angina; Arrhythmia; Congestive Heart Failure; Coronary Artery Disease; Deep Vein Thrombosis; Hypotension; Myocardial Infarction; Peripheral Arterial Disease; Peripheral Venous Disease; Phlebitis; Vasculitis Kinzler, Tae ([id number]) Gastrointestinal Complaints and Symptoms: Negative for: Frequent diarrhea; Nausea; Vomiting Medical History: Negative for: Cirrhosis ; Colitis; Crohnos; Hepatitis A; Hepatitis B; Hepatitis C Endocrine Complaints and Symptoms: Positive for: Thyroid disease - Hypothyroidism- past Review of System Notes: hypogonadism Medical History: Negative for: Type I Diabetes; Type II Diabetes Genitourinary Complaints and Symptoms: Negative for: Kidney failure/ Dialysis; Incontinence/dribbling Medical History: Negative for: End Stage Renal Disease Immunological Complaints and Symptoms: Negative for: Hives; Itching Medical History: Negative for: Lupus Erythematosus; Raynaudos; Scleroderma Integumentary (Skin) Complaints and Symptoms: Positive for: Breakdown; Swelling Negative for: Wounds; Bleeding or bruising tendency Medical History: Negative for: History of Burn; History of pressure wounds Musculoskeletal Complaints and Symptoms: Negative for: Muscle Pain; Muscle Weakness Medical History: Negative for: Gout; Rheumatoid Arthritis; Osteoarthritis; Osteomyelitis Psychiatric Complaints and Symptoms: Negative for: Anxiety; Claustrophobia Medical History: Negative for: Anorexia/bulimia; Confinement Anxiety Oncologic Sabino, Dyshawn ([id number]) Medical History: Negative for: Received Chemotherapy; Received Radiation Immunizations Pneumococcal Vaccine: Received Pneumococcal Vaccination: No Implantable Devices None Family and Social History Cancer: Yes - Maternal Grandparents; Diabetes: Yes - Mother,Father; Heart Disease: Yes - Father; Hereditary Spherocytosis: No; Hypertension: Yes - Father,Mother; Kidney  Disease: No; Lung Disease: No; Seizures: No; Stroke: No; Thyroid Problems: Yes - Mother; Tuberculosis: No; Never smoker; Alcohol Use: Never; Drug Use: No History; Caffeine Use: Daily; Financial Concerns: No; Food, Clothing  or Shelter Needs: No; Support System Lacking: No; Transportation Concerns: No Electronic Signature(s) Signed: 03/02/2019 4:39:36 PM By: Arnette Norris Signed: 03/02/2019 6:03:55 PM By: Baltazar Najjar MD Entered By: Arnette Norris on 03/02/2019 14:17:03 [place], Dhiren ([id number]) -------------------------------------------------------------------------------- SuperBill Details Patient Name: [name] Repress, [place] Date of Service: 03/02/2019 Medical Record Number: [id number] Patient Account Number: [id number] Date of Birth/Sex: [birth year] (43 y.o. M) Treating RN: Huel Coventry Primary Care Provider: Daniel Nones Other Clinician: Referring Provider: Daniel Nones Treating Provider/Extender: Maxwell Caul Weeks in Treatment: 0 Diagnosis Coding ICD-10 Codes Code Description 5860925832 Non-pressure chronic ulcer of right calf limited to breakdown of skin L03.115 Cellulitis of right lower limb I87.311 Chronic venous hypertension (idiopathic) with ulcer of right lower extremity Facility Procedures CPT4 Code: 21308657 Description: 99213 - WOUND CARE VISIT-LEV 3 EST PT Modifier: Quantity: 1 CPT4 Code: 84696295 Description: 28413 - DEBRIDE W/O ANES NON SELECT Modifier: Quantity: 1 Physician Procedures CPT4 Code: 2440102 Description: 99204 - WC PHYS LEVEL 4 - NEW PT ICD-10 Diagnosis Description L97.211 Non-pressure chronic ulcer of right calf limited to breakdown L03.115 Cellulitis of right lower limb I87.311 Chronic venous hypertension (idiopathic) with ulcer of right Modifier: of skin lower extremity Quantity: 1 Electronic Signature(s) Signed: 03/02/2019 6:03:55 PM By: Baltazar Najjar MD Entered By: Baltazar Najjar on 03/02/2019 15:45:28

## 2019-03-03 NOTE — Progress Notes (Signed)
[name] Walker, [name] Walker ([id number]) Visit Report for 03/02/2019 Allergy List Details Patient Name: [name] Walker, [name] Walker Date of Service: 03/02/2019 2:00 PM Medical Record Number: [id number] Patient Account Number: [id number] Date of Birth/Sex: 1975-11-25 (43 y.o. M) Treating RN: Arnette NorrisBiell, Kristina Primary Care Terrisha Lopata: Daniel NonesKLEIN, BERT Other Clinician: Referring Needham Biggins: Ignacia Bayleyumey, Robert Treating Zayne Marovich/Extender: Maxwell CaulOBSON, MICHAEL G Weeks in Treatment: 0 Allergies Active Allergies amoxicillin Reaction: unknown Maxzide Reaction: unknown Allergy Notes Electronic Signature(s) Signed: 03/02/2019 4:39:36 PM By: Arnette NorrisBiell, Kristina Entered By: Arnette NorrisBiell, Kristina on 03/02/2019 14:11:27 Flood, Thoams ([id number]) -------------------------------------------------------------------------------- Arrival Information Details Patient Name: [name] Walker, [name] Walker Date of Service: 03/02/2019 2:00 PM Medical Record Number: [id number] Patient Account Number: [id number] Date of Birth/Sex: 1975-11-25 (43 y.o. M) Treating RN: Arnette NorrisBiell, Kristina Primary Care Deundre Thong: Daniel NonesKLEIN, BERT Other Clinician: Referring Evadna Donaghy: Ignacia Bayleyumey, Robert Treating Moussa Wiegand/Extender: Altamese CarolinaOBSON, MICHAEL G Weeks in Treatment: 0 Visit Information Patient Arrived: Ambulatory Arrival Time: 14:02 Accompanied By: self Transfer Assistance: None Patient Identification Verified: Yes Secondary Verification Process Yes Completed: Patient Has Alerts: Yes Patient Alerts: Non- compressible Electronic Signature(s) Signed: 03/02/2019 4:39:36 PM By: Arnette NorrisBiell, Kristina Entered By: Arnette NorrisBiell, Kristina on 03/02/2019 14:35:59 Savage, Christofer ([id number]) -------------------------------------------------------------------------------- Clinic Level of Care Assessment Details Patient Name: [name] Walker, [name] Walker Date of Service: 03/02/2019 2:00 PM Medical Record Number: [id number] Patient Account Number: [id number] Date of Birth/Sex: 1975-11-25 (43 y.o. M) Treating RN: Huel CoventryWoody, Kim Primary Care Belmira Daley: Daniel NonesKLEIN, BERT  Other Clinician: Referring Iann Rodier: Ignacia Bayleyumey, Robert Treating Zenon Leaf/Extender: Altamese CarolinaOBSON, MICHAEL G Weeks in Treatment: 0 Clinic Level of Care Assessment Items TOOL 1 Quantity Score []  - Use when EandM and Procedure is performed on INITIAL visit 0 ASSESSMENTS - Nursing Assessment / Reassessment X - General Physical Exam (combine w/ comprehensive assessment (listed just below) when 1 20 performed on new pt. evals) X- 1 25 Comprehensive Assessment (HX, ROS, Risk Assessments, Wounds Hx, etc.) ASSESSMENTS - Wound and Skin Assessment / Reassessment []  - Dermatologic / Skin Assessment (not related to wound area) 0 ASSESSMENTS - Ostomy and/or Continence Assessment and Care []  - Incontinence Assessment and Management 0 []  - 0 Ostomy Care Assessment and Management (repouching, etc.) PROCESS - Coordination of Care X - Simple Patient / Family Education for ongoing care 1 15 []  - 0 Complex (extensive) Patient / Family Education for ongoing care X- 1 10 Staff obtains ChiropractorConsents, Records, Test Results / Process Orders []  - 0 Staff telephones HHA, Nursing Homes / Clarify orders / etc []  - 0 Routine Transfer to another Facility (non-emergent condition) []  - 0 Routine Hospital Admission (non-emergent condition) X- 1 15 New Admissions / Manufacturing engineernsurance Authorizations / Ordering NPWT, Apligraf, etc. []  - 0 Emergency Hospital Admission (emergent condition) PROCESS - Special Needs []  - Pediatric / Minor Patient Management 0 []  - 0 Isolation Patient Management []  - 0 Hearing / Language / Visual special needs []  - 0 Assessment of Community assistance (transportation, D/C planning, etc.) []  - 0 Additional assistance / Altered mentation []  - 0 Support Surface(s) Assessment (bed, cushion, seat, etc.) Monks, Kacen ([id number]) INTERVENTIONS - Miscellaneous []  - External ear exam 0 []  - 0 Patient Transfer (multiple staff / Nurse, adultHoyer Lift / Similar devices) []  - 0 Simple Staple / Suture removal (25 or  less) []  - 0 Complex Staple / Suture removal (26 or more) []  - 0 Hypo/Hyperglycemic Management (do not check if billed separately) X- 1 15 Ankle / Brachial Index (ABI) - do not check if billed separately Has the patient been seen at the hospital within the last three years: Yes Total Score: 100 Level Of Care: New/Established -  Level 3 Electronic Signature(s) Signed: 03/03/2019 4:58:07 PM By: Gretta Cool, BSN, RN, CWS, Kim RN, BSN Entered By: Gretta Cool, BSN, RN, CWS, Kim on 03/02/2019 15:11:14 [place], Alexsandro ([id number]) -------------------------------------------------------------------------------- Encounter Discharge Information Details Patient Name: [name] Walker, [name] Walker Date of Service: 03/02/2019 2:00 PM Medical Record Number: [id number] Patient Account Number: [id number] Date of Birth/Sex: Jul 12, 1976 (43 y.o. M) Treating RN: Montey Hora Primary Care Lyrik Buresh: Ramonita Lab Other Clinician: Referring Ruqayya Ventress: Mortimer Fries Treating Berdene Askari/Extender: Tito Dine in Treatment: 0 Encounter Discharge Information Items Post Procedure Vitals Discharge Condition: Stable Temperature (F): 98.2 Ambulatory Status: Ambulatory Pulse (bpm): 90 Discharge Destination: Home Respiratory Rate (breaths/min): 18 Transportation: Private Auto Blood Pressure (mmHg): 175/80 Accompanied By: self Schedule Follow-up Appointment: Yes Clinical Summary of Care: Electronic Signature(s) Signed: 03/02/2019 3:50:20 PM By: Montey Hora Entered By: Montey Hora on 03/02/2019 15:50:20 Clabo, Curt ([id number]) -------------------------------------------------------------------------------- Lower Extremity Assessment Details Patient Name: [name] Walker, [name] Walker Date of Service: 03/02/2019 2:00 PM Medical Record Number: [id number] Patient Account Number: [id number] Date of Birth/Sex: 1976-04-18 (43 y.o. M) Treating RN: Harold Barban Primary Care Seidy Labreck: Ramonita Lab Other Clinician: Referring Shelie Lansing: Mortimer Fries Treating Joette Schmoker/Extender: Tito Dine in Treatment: 0 Edema Assessment Assessed: [Left: No] [Right: No] Edema: [Left: Yes] [Right: Yes] Calf Left: Right: Point of Measurement: 33 cm From Medial Instep 51 cm 52 cm Ankle Left: Right: Point of Measurement: 10 cm From Medial Instep 31.5 cm 33 cm Vascular Assessment Pulses: Dorsalis Pedis Palpable: [Left:Yes] [Right:Yes] Doppler Audible: [Left:Yes] [Right:Yes] Posterior Tibial Palpable: [Left:No Yes] [Right:No Yes] Notes Non-compressible Electronic Signature(s) Signed: 03/02/2019 4:39:36 PM By: Harold Barban Entered By: Harold Barban on 03/02/2019 14:35:11 [place] Cruz, Tyrelle ([id number]) -------------------------------------------------------------------------------- Multi Wound Chart Details Patient Name: [name] Walker, Nishan Date of Service: 03/02/2019 2:00 PM Medical Record Number: [id number] Patient Account Number: [id number] Date of Birth/Sex: Jun 15, 1976 (43 y.o. M) Treating RN: Cornell Barman Primary Care Damier Disano: Ramonita Lab Other Clinician: Referring Zyren Sevigny: Mortimer Fries Treating Dealie Koelzer/Extender: Tito Dine in Treatment: 0 Vital Signs Height(in): 66 Pulse(bpm): 90 Weight(lbs): 536 Blood Pressure(mmHg): 175/80 Body Mass Index(BMI): 71 Temperature(F): 98.2 Respiratory Rate 18 (breaths/min): Photos: [N/A:N/A] Wound Location: Right Lower Leg - Posterior N/A N/A Wounding Event: Gradually Appeared N/A N/A Primary Etiology: Lymphedema N/A N/A Secondary Etiology: Venous Leg Ulcer N/A N/A Comorbid History: Hypertension N/A N/A Date Acquired: 02/24/2019 N/A N/A Weeks of Treatment: 0 N/A N/A Wound Status: Open N/A N/A Measurements L x W x D 19x24x0.1 N/A N/A (cm) Area (cm) : 358.142 N/A N/A Volume (cm) : 35.814 N/A N/A Classification: Full Thickness Without N/A N/A Exposed Support Structures Exudate Amount: Large N/A N/A Exudate Type: Serous N/A N/A Exudate Color: amber N/A N/A Wound  Margin: Flat and Intact N/A N/A Granulation Amount: Small (1-33%) N/A N/A Granulation Quality: Pale N/A N/A Dipierro, Jash ([id number]) Necrotic Amount: Large (67-100%) N/A N/A Exposed Structures: Fat Layer (Subcutaneous N/A N/A Tissue) Exposed: Yes Fascia: No Tendon: No Muscle: No Joint: No Bone: No Epithelialization: None N/A N/A Debridement: Chemical/Enzymatic/Mechanical N/A N/A Pre-procedure 15:00 N/A N/A Verification/Time Out Taken: Pain Control: Lidocaine N/A N/A Instrument: Other(Saline and gauze) N/A N/A Bleeding: Minimum N/A N/A Hemostasis Achieved: Pressure N/A N/A Debridement Treatment Procedure was tolerated well N/A N/A Response: Post Debridement 19x24x0.1 N/A N/A Measurements L x W x D (cm) Post Debridement Volume: 35.814 N/A N/A (cm) Procedures Performed: Debridement N/A N/A Treatment Notes Electronic Signature(s) Signed: 03/02/2019 6:03:55 PM By: Linton Ham MD Entered By: Linton Ham on 03/02/2019 15:35:24 Gomm, Ron ([id number]) -------------------------------------------------------------------------------- Multi-Disciplinary Care Plan Details Patient Name: [name] Walker,  Makyle Date of Service: 03/02/2019 2:00 PM Medical Record Number: [id number] Patient Account Number: [id number] Date of Birth/Sex: [birth year] (43 y.o. M) Treating RN: Huel CoventryWoody, Kim Primary Care Lalla Laham: Daniel NonesKLEIN, BERT Other Clinician: Referring Alpha Mysliwiec: Ignacia Bayleyumey, Robert Treating Adelaide Pfefferkorn/Extender: Altamese CarolinaOBSON, MICHAEL G Weeks in Treatment: 0 Active Inactive Orientation to the Wound Care Program Nursing Diagnoses: Knowledge deficit related to the wound healing center program Goals: Patient/caregiver will verbalize understanding of the Wound Healing Center Program Date Initiated: 03/02/2019 Target Resolution Date: 03/02/2019 Goal Status: Active Interventions: Provide education on orientation to the wound center Notes: Soft Tissue Infection Nursing Diagnoses: Impaired tissue  integrity Potential for infection: soft tissue Goals: Patient/caregiver will verbalize understanding of or measures to prevent infection and contamination in the home setting Date Initiated: 03/02/2019 Target Resolution Date: 03/02/2019 Goal Status: Active Patient's soft tissue infection will resolve Date Initiated: 03/02/2019 Target Resolution Date: 03/09/2019 Goal Status: Active Signs and symptoms of infection will be recognized early to allow for prompt treatment Date Initiated: 03/02/2019 Target Resolution Date: 03/09/2019 Goal Status: Active Interventions: Assess signs and symptoms of infection every visit Treatment Activities: Systemic antibiotics : 03/02/2019 Notes: Wound/Skin Impairment Celestine, Dreshawn ([id number]) Nursing Diagnoses: Impaired tissue integrity Knowledge deficit related to ulceration/compromised skin integrity Goals: Patient/caregiver will verbalize understanding of skin care regimen Date Initiated: 03/02/2019 Target Resolution Date: 03/02/2019 Goal Status: Active Ulcer/skin breakdown will have a volume reduction of 30% by week 4 Date Initiated: 03/02/2019 Target Resolution Date: 03/30/2019 Goal Status: Active Interventions: Assess ulceration(s) every visit Provide education on ulcer and skin care Treatment Activities: Skin care regimen initiated : 03/02/2019 Topical wound management initiated : 03/02/2019 Notes: Electronic Signature(s) Signed: 03/03/2019 4:58:07 PM By: Elliot GurneyWoody, BSN, RN, CWS, Kim RN, BSN Entered By: Elliot GurneyWoody, BSN, RN, CWS, Kim on 03/02/2019 14:52:52 Llorente, Taven ([id number]) -------------------------------------------------------------------------------- Pain Assessment Details Patient Name: [name] Walker, Ammon Date of Service: 03/02/2019 2:00 PM Medical Record Number: [id number] Patient Account Number: [id number] Date of Birth/Sex: [birth year] (43 y.o. M) Treating RN: Arnette NorrisBiell, Kristina Primary Care Shelly Spenser: Daniel NonesKLEIN, BERT Other Clinician: Referring Kunal Levario:  Ignacia Bayleyumey, Robert Treating Oluwasemilore Pascuzzi/Extender: Altamese CarolinaOBSON, MICHAEL G Weeks in Treatment: 0 Active Problems Location of Pain Severity and Description of Pain Patient Has Paino Yes Site Locations Rate the pain. Current Pain Level: 3 Pain Management and Medication Current Pain Management: Electronic Signature(s) Signed: 03/02/2019 4:39:36 PM By: Arnette NorrisBiell, Kristina Entered By: Arnette NorrisBiell, Kristina on 03/02/2019 14:08:15 Dubeau, Kalonji ([id number]) -------------------------------------------------------------------------------- Patient/Caregiver Education Details Patient Name: [name] Walker, Yohann Date of Service: 03/02/2019 2:00 PM Medical Record Number: [id number] Patient Account Number: [id number] Date of Birth/Gender: [birth year] (43 y.o. M) Treating RN: Huel CoventryWoody, Kim Primary Care Physician: Daniel NonesKLEIN, BERT Other Clinician: Referring Physician: Ignacia Bayleyumey, Robert Treating Physician/Extender: Altamese CarolinaOBSON, MICHAEL G Weeks in Treatment: 0 Education Assessment Education Provided To: Patient Education Topics Provided Infection: Handouts: Infection Prevention and Management Methods: Demonstration, Explain/Verbal Responses: State content correctly Welcome To The Wound Care Center: Handouts: Welcome To The Wound Care Center Methods: Demonstration, Explain/Verbal Responses: State content correctly Wound/Skin Impairment: Handouts: Caring for Your Ulcer Methods: Demonstration, Explain/Verbal Responses: State content correctly Electronic Signature(s) Signed: 03/03/2019 4:58:07 PM By: Elliot GurneyWoody, BSN, RN, CWS, Kim RN, BSN Entered By: Elliot GurneyWoody, BSN, RN, CWS, Kim on 03/02/2019 15:11:55 Barot, Eloy ([id number]) -------------------------------------------------------------------------------- Wound Assessment Details Patient Name: [name] Walker, Dmarion Date of Service: 03/02/2019 2:00 PM Medical Record Number: [id number] Patient Account Number: [id number] Date of Birth/Sex: [birth year] (43 y.o. M) Treating RN: Arnette NorrisBiell, Kristina Primary Care Virgilio Broadhead:  Daniel NonesKLEIN, BERT Other Clinician: Referring Josecarlos Harriott: Ignacia Bayleyumey, Robert Treating Sunshyne Horvath/Extender: Maxwell CaulOBSON, MICHAEL G Weeks in Treatment: 0 Wound Status  Wound Number: 1 Primary Etiology: Lymphedema Wound Location: Right Lower Leg - Posterior Secondary Etiology: Venous Leg Ulcer Wounding Event: Gradually Appeared Wound Status: Open Date Acquired: 02/24/2019 Comorbid History: Hypertension Weeks Of Treatment: 0 Clustered Wound: No Photos Wound Measurements Length: (cm) 19 Width: (cm) 24 Depth: (cm) 0.1 Area: (cm) 358.142 Volume: (cm) 35.814 % Reduction in Area: % Reduction in Volume: Epithelialization: None Tunneling: No Undermining: No Wound Description Full Thickness Without Exposed Support Classification: Structures Wound Margin: Flat and Intact Exudate Large Amount: Exudate Type: Serous Exudate Color: amber Foul Odor After Cleansing: No Slough/Fibrino Yes Wound Bed Granulation Amount: Small (1-33%) Exposed Structure Granulation Quality: Pale Fascia Exposed: No Necrotic Amount: Large (67-100%) Fat Layer (Subcutaneous Tissue) Exposed: Yes Necrotic Quality: Adherent Slough Tendon Exposed: No Muscle Exposed: No Joint Exposed: No Bone Exposed: No Persinger, Ikechukwu ([id number]) Treatment Notes Wound #1 (Right, Posterior Lower Leg) Notes silvercel, abd, kerlix with tubigrip Electronic Signature(s) Signed: 03/02/2019 4:39:36 PM By: Arnette NorrisBiell, Kristina Entered By: Arnette NorrisBiell, Kristina on 03/02/2019 14:25:53 Hussein, Jamar ([id number]) -------------------------------------------------------------------------------- Vitals Details Patient Name: [name] Walker, [place] Date of Service: 03/02/2019 2:00 PM Medical Record Number: [id number] Patient Account Number: [id number] Date of Birth/Sex: 1975/11/18 (43 y.o. M) Treating RN: Arnette NorrisBiell, Kristina Primary Care Denaisha Swango: Daniel NonesKLEIN, BERT Other Clinician: Referring Reine Bristow: Ignacia Bayleyumey, Robert Treating Shanekqua Schaper/Extender: Altamese CarolinaOBSON, MICHAEL G Weeks in Treatment:  0 Vital Signs Time Taken: 14:00 Temperature (F): 98.2 Height (in): 66 Pulse (bpm): 90 Source: Stated Respiratory Rate (breaths/min): 18 Weight (lbs): 442 Blood Pressure (mmHg): 175/80 Source: Stated Reference Range: 80 - 120 mg / dl Body Mass Index (BMI): 71.3 Electronic Signature(s) Signed: 03/02/2019 4:39:36 PM By: Arnette NorrisBiell, Kristina Entered By: Arnette NorrisBiell, Kristina on 03/02/2019 14:10:06

## 2019-03-04 ENCOUNTER — Encounter: Payer: BC Managed Care – PPO | Admitting: Physician Assistant

## 2019-03-04 ENCOUNTER — Other Ambulatory Visit: Payer: Self-pay

## 2019-03-04 ENCOUNTER — Encounter (INDEPENDENT_AMBULATORY_CARE_PROVIDER_SITE_OTHER): Payer: BC Managed Care – PPO | Admitting: Vascular Surgery

## 2019-03-04 DIAGNOSIS — I10 Essential (primary) hypertension: Secondary | ICD-10-CM | POA: Diagnosis not present

## 2019-03-04 DIAGNOSIS — L03115 Cellulitis of right lower limb: Secondary | ICD-10-CM | POA: Diagnosis not present

## 2019-03-04 DIAGNOSIS — Z880 Allergy status to penicillin: Secondary | ICD-10-CM | POA: Diagnosis not present

## 2019-03-04 DIAGNOSIS — Z8349 Family history of other endocrine, nutritional and metabolic diseases: Secondary | ICD-10-CM | POA: Diagnosis not present

## 2019-03-04 DIAGNOSIS — I872 Venous insufficiency (chronic) (peripheral): Secondary | ICD-10-CM | POA: Diagnosis not present

## 2019-03-04 DIAGNOSIS — Z809 Family history of malignant neoplasm, unspecified: Secondary | ICD-10-CM | POA: Diagnosis not present

## 2019-03-04 DIAGNOSIS — Z881 Allergy status to other antibiotic agents status: Secondary | ICD-10-CM | POA: Diagnosis not present

## 2019-03-04 DIAGNOSIS — I87311 Chronic venous hypertension (idiopathic) with ulcer of right lower extremity: Secondary | ICD-10-CM | POA: Diagnosis not present

## 2019-03-04 DIAGNOSIS — Z6841 Body Mass Index (BMI) 40.0 and over, adult: Secondary | ICD-10-CM | POA: Diagnosis not present

## 2019-03-04 DIAGNOSIS — L97211 Non-pressure chronic ulcer of right calf limited to breakdown of skin: Secondary | ICD-10-CM | POA: Diagnosis not present

## 2019-03-04 DIAGNOSIS — Z8249 Family history of ischemic heart disease and other diseases of the circulatory system: Secondary | ICD-10-CM | POA: Diagnosis not present

## 2019-03-04 DIAGNOSIS — I890 Lymphedema, not elsewhere classified: Secondary | ICD-10-CM | POA: Diagnosis not present

## 2019-03-04 DIAGNOSIS — G4733 Obstructive sleep apnea (adult) (pediatric): Secondary | ICD-10-CM | POA: Diagnosis not present

## 2019-03-05 LAB — AEROBIC CULTURE W GRAM STAIN (SUPERFICIAL SPECIMEN)

## 2019-03-05 NOTE — Progress Notes (Signed)
[name] Walker, [name] Walker ([id number]) Visit Report for 03/04/2019 Arrival Information Details Patient Name: [name] Walker, [name] Walker Date of Service: 03/04/2019 9:30 AM Medical Record Number: [id number] Patient Account Number: [id number] Date of Birth/Sex: 06-17-1976 (43 y.o. M) Treating RN: Arnette NorrisBiell, Kristina Primary Care Ivry Pigue: Daniel NonesKLEIN, BERT Other Clinician: Referring Yanis Juma: Daniel NonesKLEIN, BERT Treating Logon Uttech/Extender: Linwood DibblesSTONE III, HOYT Weeks in Treatment: 0 Visit Information History Since Last Visit Added or deleted any medications: No Patient Arrived: Ambulatory Any new allergies or adverse reactions: No Arrival Time: 09:27 Had a fall or experienced change in No Accompanied By: self activities of daily living that may affect Transfer Assistance: None risk of falls: Patient Identification Verified: Yes Signs or symptoms of abuse/neglect since last visito No Secondary Verification Process Yes Hospitalized since last visit: No Completed: Has Dressing in Place as Prescribed: Yes Patient Has Alerts: Yes Pain Present Now: No Patient Alerts: Non- compressible Electronic Signature(s) Signed: 03/04/2019 4:04:00 PM By: Arnette NorrisBiell, Kristina Entered By: Arnette NorrisBiell, Kristina on 03/04/2019 09:28:54 [name] Walker, [name] Walker ([id number]) -------------------------------------------------------------------------------- Clinic Level of Care Assessment Details Patient Name: [name] Walker, Bishop Date of Service: 03/04/2019 9:30 AM Medical Record Number: [id number] Patient Account Number: [id number] Date of Birth/Sex: 06-17-1976 (43 y.o. M) Treating RN: Curtis Sitesorthy, Joanna Primary Care Caylah Plouff: Daniel NonesKLEIN, BERT Other Clinician: Referring Zephyr Ridley: Daniel NonesKLEIN, BERT Treating Mariza Bourget/Extender: Linwood DibblesSTONE III, HOYT Weeks in Treatment: 0 Clinic Level of Care Assessment Items TOOL 4 Quantity Score []  - Use when only an EandM is performed on FOLLOW-UP visit 0 ASSESSMENTS - Nursing Assessment / Reassessment X - Reassessment of Co-morbidities (includes updates in patient status)  1 10 X- 1 5 Reassessment of Adherence to Treatment Plan ASSESSMENTS - Wound and Skin Assessment / Reassessment X - Simple Wound Assessment / Reassessment - one wound 1 5 []  - 0 Complex Wound Assessment / Reassessment - multiple wounds X- 1 10 Dermatologic / Skin Assessment (not related to wound area) ASSESSMENTS - Focused Assessment X - Circumferential Edema Measurements - multi extremities 1 5 []  - 0 Nutritional Assessment / Counseling / Intervention X- 1 5 Lower Extremity Assessment (monofilament, tuning fork, pulses) []  - 0 Peripheral Arterial Disease Assessment (using hand held doppler) ASSESSMENTS - Ostomy and/or Continence Assessment and Care []  - Incontinence Assessment and Management 0 []  - 0 Ostomy Care Assessment and Management (repouching, etc.) PROCESS - Coordination of Care X - Simple Patient / Family Education for ongoing care 1 15 []  - 0 Complex (extensive) Patient / Family Education for ongoing care X- 1 10 Staff obtains ChiropractorConsents, Records, Test Results / Process Orders []  - 0 Staff telephones HHA, Nursing Homes / Clarify orders / etc []  - 0 Routine Transfer to another Facility (non-emergent condition) []  - 0 Routine Hospital Admission (non-emergent condition) []  - 0 New Admissions / Manufacturing engineernsurance Authorizations / Ordering NPWT, Apligraf, etc. []  - 0 Emergency Hospital Admission (emergent condition) X- 1 10 Simple Discharge Coordination [name] Walker, [name] Walker ([id number]) []  - 0 Complex (extensive) Discharge Coordination PROCESS - Special Needs []  - Pediatric / Minor Patient Management 0 []  - 0 Isolation Patient Management []  - 0 Hearing / Language / Visual special needs []  - 0 Assessment of Community assistance (transportation, D/C planning, etc.) []  - 0 Additional assistance / Altered mentation []  - 0 Support Surface(s) Assessment (bed, cushion, seat, etc.) INTERVENTIONS - Wound Cleansing / Measurement X - Simple Wound Cleansing - one wound 1 5 []  -  0 Complex Wound Cleansing - multiple wounds X- 1 5 Wound Imaging (photographs - any number of wounds) []  - 0 Wound Tracing (instead of photographs) X-  1 5 Simple Wound Measurement - one wound []  - 0 Complex Wound Measurement - multiple wounds INTERVENTIONS - Wound Dressings []  - Small Wound Dressing one or multiple wounds 0 X- 1 15 Medium Wound Dressing one or multiple wounds []  - 0 Large Wound Dressing one or multiple wounds []  - 0 Application of Medications - topical []  - 0 Application of Medications - injection INTERVENTIONS - Miscellaneous []  - External ear exam 0 []  - 0 Specimen Collection (cultures, biopsies, blood, body fluids, etc.) []  - 0 Specimen(s) / Culture(s) sent or taken to Lab for analysis []  - 0 Patient Transfer (multiple staff / Civil Service fast streamer / Similar devices) []  - 0 Simple Staple / Suture removal (25 or less) []  - 0 Complex Staple / Suture removal (26 or more) []  - 0 Hypo / Hyperglycemic Management (close monitor of Blood Glucose) []  - 0 Ankle / Brachial Index (ABI) - do not check if billed separately X- 1 5 Vital Signs [name] Walker, [name] Walker ([id number]) Has the patient been seen at the hospital within the last three years: Yes Total Score: 110 Level Of Care: New/Established - Level 3 Electronic Signature(s) Signed: 03/04/2019 5:01:18 PM By: Montey Hora Entered By: Montey Hora on 03/04/2019 10:00:28 [name] Walker, [name] Walker ([id number]) -------------------------------------------------------------------------------- Encounter Discharge Information Details Patient Name: [name] Walker, [name] Walker Date of Service: 03/04/2019 9:30 AM Medical Record Number: [id number] Patient Account Number: [id number] Date of Birth/Sex: [birth year] (43 y.o. M) Treating RN: Montey Hora Primary Care Aleksia Freiman: Ramonita Lab Other Clinician: Referring Azarias Chiou: Ramonita Lab Treating Shyquan Stallbaumer/Extender: Melburn Hake, HOYT Weeks in Treatment: 0 Encounter Discharge Information Items Discharge Condition:  Stable Ambulatory Status: Ambulatory Discharge Destination: Home Transportation: Private Auto Accompanied By: self Schedule Follow-up Appointment: Yes Clinical Summary of Care: Electronic Signature(s) Signed: 03/04/2019 10:12:52 AM By: Montey Hora Entered By: Montey Hora on 03/04/2019 10:12:51 [name] Walker, [name] Walker ([id number]) -------------------------------------------------------------------------------- Lower Extremity Assessment Details Patient Name: [name] Walker, [name] Walker Date of Service: 03/04/2019 9:30 AM Medical Record Number: [id number] Patient Account Number: [id number] Date of Birth/Sex: [birth year] (43 y.o. M) Treating RN: Harold Barban Primary Care Aika Brzoska: Ramonita Lab Other Clinician: Referring Kahlee Metivier: Ramonita Lab Treating Izzac Rockett/Extender: Melburn Hake, HOYT Weeks in Treatment: 0 Edema Assessment Assessed: [Left: No] [Right: No] [Left: Edema] [Right: :] Calf Left: Right: Point of Measurement: 33 cm From Medial Instep cm 51 cm Ankle Left: Right: Point of Measurement: 10 cm From Medial Instep cm 31.5 cm Vascular Assessment Pulses: Dorsalis Pedis Palpable: [Right:Yes] Posterior Tibial Palpable: [Right:Yes] Electronic Signature(s) Signed: 03/04/2019 4:04:00 PM By: Harold Barban Entered By: Harold Barban on 03/04/2019 09:38:10 [name] Walker, [name] Walker ([id number]) -------------------------------------------------------------------------------- Multi Wound Chart Details Patient Name: [name] Walker, [name] Walker Date of Service: 03/04/2019 9:30 AM Medical Record Number: [id number] Patient Account Number: [id number] Date of Birth/Sex: Jul 20, 1976 (43 y.o. M) Treating RN: Montey Hora Primary Care Francies Inch: Ramonita Lab Other Clinician: Referring Nael Petrosyan: Ramonita Lab Treating Rainee Sweatt/Extender: Melburn Hake, HOYT Weeks in Treatment: 0 Vital Signs Height(in): 66 Pulse(bpm): 81 Weight(lbs): 756 Blood Pressure(mmHg): 147/76 Body Mass Index(BMI): 71 Temperature(F): 98.1 Respiratory  Rate 18 (breaths/min): Photos: [N/A:N/A] Wound Location: Right Lower Leg - Posterior N/A N/A Wounding Event: Gradually Appeared N/A N/A Primary Etiology: Lymphedema N/A N/A Secondary Etiology: Venous Leg Ulcer N/A N/A Comorbid History: Hypertension N/A N/A Date Acquired: 02/24/2019 N/A N/A Weeks of Treatment: 0 N/A N/A Wound Status: Open N/A N/A Measurements L x W x D 19x27x0.1 N/A N/A (cm) Area (cm) : 402.909 N/A N/A Volume (cm) : 40.291 N/A N/A % Reduction in Area: -12.50% N/A N/A % Reduction in Volume: -12.50% N/A N/A Classification:  Full Thickness Without N/A N/A Exposed Support Structures Exudate Amount: Large N/A N/A Exudate Type: Serous N/A N/A Exudate Color: amber N/A N/A Wound Margin: Flat and Intact N/A N/A Granulation Amount: Small (1-33%) N/A N/A Granulation Quality: Pale N/A N/A Necrotic Amount: Large (67-100%) N/A N/A Exposed Structures: Fat Layer (Subcutaneous N/A N/A Tissue) Exposed: Yes Fascia: No Tendon: No Muscle: No Stricker, Jermone ([id number]) Joint: No Bone: No Epithelialization: None N/A N/A Treatment Notes Electronic Signature(s) Signed: 03/04/2019 5:01:18 PM By: Curtis Sitesorthy, Joanna Entered By: Curtis Sitesorthy, Joanna on 03/04/2019 09:51:45 Burnley, Kristi ([id number]) -------------------------------------------------------------------------------- Multi-Disciplinary Care Plan Details Patient Name: [name] Walker, [name] Walker Date of Service: 03/04/2019 9:30 AM Medical Record Number: [id number] Patient Account Number: [id number] Date of Birth/Sex: 26-Jun-1976 (43 y.o. M) Treating RN: Curtis Sitesorthy, Joanna Primary Care Etheleen Valtierra: Daniel NonesKLEIN, BERT Other Clinician: Referring Oyindamola Key: Daniel NonesKLEIN, BERT Treating Clebert Wenger/Extender: Linwood DibblesSTONE III, HOYT Weeks in Treatment: 0 Active Inactive Orientation to the Wound Care Program Nursing Diagnoses: Knowledge deficit related to the wound healing center program Goals: Patient/caregiver will verbalize understanding of the Wound Healing Center Program Date  Initiated: 03/02/2019 Target Resolution Date: 03/02/2019 Goal Status: Active Interventions: Provide education on orientation to the wound center Notes: Soft Tissue Infection Nursing Diagnoses: Impaired tissue integrity Potential for infection: soft tissue Goals: Patient/caregiver will verbalize understanding of or measures to prevent infection and contamination in the home setting Date Initiated: 03/02/2019 Target Resolution Date: 03/02/2019 Goal Status: Active Patient's soft tissue infection will resolve Date Initiated: 03/02/2019 Target Resolution Date: 03/09/2019 Goal Status: Active Signs and symptoms of infection will be recognized early to allow for prompt treatment Date Initiated: 03/02/2019 Target Resolution Date: 03/09/2019 Goal Status: Active Interventions: Assess signs and symptoms of infection every visit Treatment Activities: Systemic antibiotics : 03/02/2019 Notes: Wound/Skin Impairment [name] Walker, [name] Walker ([id number]) Nursing Diagnoses: Impaired tissue integrity Knowledge deficit related to ulceration/compromised skin integrity Goals: Patient/caregiver will verbalize understanding of skin care regimen Date Initiated: 03/02/2019 Target Resolution Date: 03/02/2019 Goal Status: Active Ulcer/skin breakdown will have a volume reduction of 30% by week 4 Date Initiated: 03/02/2019 Target Resolution Date: 03/30/2019 Goal Status: Active Interventions: Assess ulceration(s) every visit Provide education on ulcer and skin care Treatment Activities: Skin care regimen initiated : 03/02/2019 Topical wound management initiated : 03/02/2019 Notes: Electronic Signature(s) Signed: 03/04/2019 5:01:18 PM By: Curtis Sitesorthy, Joanna Entered By: Curtis Sitesorthy, Joanna on 03/04/2019 09:51:37 Mcduffey, Ocean ([id number]) -------------------------------------------------------------------------------- Pain Assessment Details Patient Name: [name] Walker, Kynan Date of Service: 03/04/2019 9:30 AM Medical Record Number:  [id number] Patient Account Number: [id number] Date of Birth/Sex: 26-Jun-1976 (43 y.o. M) Treating RN: Arnette NorrisBiell, Kristina Primary Care Danyetta Gillham: Daniel NonesKLEIN, BERT Other Clinician: Referring Kylar Leonhardt: Daniel NonesKLEIN, BERT Treating Ayaan Shutes/Extender: Linwood DibblesSTONE III, HOYT Weeks in Treatment: 0 Active Problems Location of Pain Severity and Description of Pain Patient Has Paino No Site Locations Pain Management and Medication Current Pain Management: Electronic Signature(s) Signed: 03/04/2019 4:04:00 PM By: Arnette NorrisBiell, Kristina Entered By: Arnette NorrisBiell, Kristina on 03/04/2019 09:29:21 Fennell, Izaak ([id number]) -------------------------------------------------------------------------------- Patient/Caregiver Education Details Patient Name: [name] Walker, Sriman Date of Service: 03/04/2019 9:30 AM Medical Record Number: [id number] Patient Account Number: [id number] Date of Birth/Gender: 26-Jun-1976 (43 y.o. M) Treating RN: Curtis Sitesorthy, Joanna Primary Care Physician: Daniel NonesKLEIN, BERT Other Clinician: Referring Physician: Daniel NonesKLEIN, BERT Treating Physician/Extender: Linwood DibblesSTONE III, HOYT Weeks in Treatment: 0 Education Assessment Education Provided To: Patient Education Topics Provided Wound/Skin Impairment: Handouts: Other: reportable s/s and when to go to ED Methods: Explain/Verbal Responses: State content correctly Electronic Signature(s) Signed: 03/04/2019 5:01:18 PM By: Curtis Sitesorthy, Joanna Entered By: Curtis Sitesorthy, Joanna on 03/04/2019 10:11:40 Tromp, Jabri ([id number]) -------------------------------------------------------------------------------- Wound Assessment Details Patient  Name: [name] Walker, Hever Date of Service: 03/04/2019 9:30 AM Medical Record Number: [id number] Patient Account Number: [id number] Date of Birth/Sex: 10-20-1975 (43 y.o. M) Treating RN: Arnette NorrisBiell, Kristina Primary Care Holden Draughon: Daniel NonesKLEIN, BERT Other Clinician: Referring Taviana Westergren: Daniel NonesKLEIN, BERT Treating Demarlo Riojas/Extender: Linwood DibblesSTONE III, HOYT Weeks in Treatment: 0 Wound Status Wound Number:  1 Primary Etiology: Lymphedema Wound Location: Right Lower Leg - Posterior Secondary Etiology: Venous Leg Ulcer Wounding Event: Gradually Appeared Wound Status: Open Date Acquired: 02/24/2019 Comorbid History: Hypertension Weeks Of Treatment: 0 Clustered Wound: No Photos Wound Measurements Length: (cm) 19 Width: (cm) 27 Depth: (cm) 0.1 Area: (cm) 402.909 Volume: (cm) 40.291 % Reduction in Area: -12.5% % Reduction in Volume: -12.5% Epithelialization: None Tunneling: No Undermining: No Wound Description Full Thickness Without Exposed Support Classification: Structures Wound Margin: Flat and Intact Exudate Large Amount: Exudate Type: Serous Exudate Color: amber Foul Odor After Cleansing: No Slough/Fibrino Yes Wound Bed Granulation Amount: Small (1-33%) Exposed Structure Granulation Quality: Pale Fascia Exposed: No Necrotic Amount: Large (67-100%) Fat Layer (Subcutaneous Tissue) Exposed: Yes Necrotic Quality: Adherent Slough Tendon Exposed: No Muscle Exposed: No Joint Exposed: No Bone Exposed: No Hollister, Rohit ([id number]) Treatment Notes Wound #1 (Right, Posterior Lower Leg) Notes silvercel, abd, kerlix with tubigrip Electronic Signature(s) Signed: 03/04/2019 4:04:00 PM By: Arnette NorrisBiell, Kristina Entered By: Arnette NorrisBiell, Kristina on 03/04/2019 09:36:49 Quirino, Zeev ([id number]) -------------------------------------------------------------------------------- Vitals Details Patient Name: [name] Walker, Draysen Date of Service: 03/04/2019 9:30 AM Medical Record Number: [id number] Patient Account Number: [id number] Date of Birth/Sex: 10-20-1975 (43 y.o. M) Treating RN: Arnette NorrisBiell, Kristina Primary Care Moniqua Engebretsen: Daniel NonesKLEIN, BERT Other Clinician: Referring Ameia Morency: Daniel NonesKLEIN, BERT Treating Kanav Kazmierczak/Extender: Linwood DibblesSTONE III, HOYT Weeks in Treatment: 0 Vital Signs Time Taken: 09:29 Temperature (F): 98.1 Height (in): 66 Pulse (bpm): 81 Weight (lbs): 442 Respiratory Rate (breaths/min): 18 Body Mass  Index (BMI): 71.3 Blood Pressure (mmHg): 147/76 Reference Range: 80 - 120 mg / dl Electronic Signature(s) Signed: 03/04/2019 4:04:00 PM By: Arnette NorrisBiell, Kristina Entered By: Arnette NorrisBiell, Kristina on 03/04/2019 09:31:29

## 2019-03-05 NOTE — Progress Notes (Signed)
[name] Walker, [name] Walker ([id number]) Visit Report for 03/04/2019 Chief Complaint Document Details Patient Name: [name] Walker, [name] Walker Date of Service: 03/04/2019 9:30 AM Medical Record Number: [id number] Patient Account Number: [id number] Date of Birth/Sex: 04-Oct-1975 (43 y.o. M) Treating RN: Curtis Sitesorthy, Joanna Primary Care Provider: Daniel NonesKLEIN, BERT Other Clinician: Referring Provider: Daniel NonesKLEIN, BERT Treating Provider/Extender: Linwood DibblesSTONE III, Mellanie Bejarano Weeks in Treatment: 0 Information Obtained from: Patient Chief Complaint 03/02/2019; patient arrives here for review of a wound on the posterior right calf in the setting of severe cellulitis of the right leg Electronic Signature(s) Signed: 03/04/2019 5:06:58 PM By: Lenda KelpStone III, Heela Heishman PA-C Entered By: Lenda KelpStone III, Benny Deutschman on 03/04/2019 09:44:55 Rainone, Dorris ([id number]) -------------------------------------------------------------------------------- HPI Details Patient Name: [name] Walker, [name] Walker Date of Service: 03/04/2019 9:30 AM Medical Record Number: [id number] Patient Account Number: [id number] Date of Birth/Sex: 04-Oct-1975 (43 y.o. M) Treating RN: Curtis Sitesorthy, Joanna Primary Care Provider: Daniel NonesKLEIN, BERT Other Clinician: Referring Provider: Daniel NonesKLEIN, BERT Treating Provider/Extender: Linwood DibblesSTONE III, Allysia Ingles Weeks in Treatment: 0 History of Present Illness HPI Description: ADMISSION 03/02/2019 This is a 43 year old man who is not a diabetic. He states he was well up until 8 days ago when he felt fatigued and generally unwell while at work. He went home and discovered intense erythema in the right leg. This was painful. He did not make it into see his doctor until 3 days later. He was put on doxycycline. He had a duplex ultrasound done at [facility]al [facility] that did not show reflux, DVT or superficial thrombophlebitis. He was put on doxycycline which she is still taking. He was put in an Radio broadcast assistantUnna boot. He was reviewed again 2 days ago and referred here. He had an Radio broadcast assistantUnna boot on when he came in here today. Patient  states he is not systemically unwell but the leg is uncomfortable. He does not have a history of wounds on his legs. He does have some discoloration of the left leg which looks like chronic venous changes however he states that the right leg did not have this prior to any changes recently. I note that he has been seen by Dr. Graciela HusbandsKlein at the [facility] worked [facility] for lower extremity edema. He had an echocardiogram. Past medical history includes hypertension, edema of both legs, obstructive sleep apnea, morbid obesity. Allergies, he is listed as being allergic to amoxicillin from 2015 and his primary doctor's office although I do not see this in epic. The patient does not remember anything about this. He does not remember being on amoxicillin. Socially; he works in a Education officer, communitysewage and water treatment. But he denies getting his leg into contact with anything particularly problematic ABIs in our clinic were noncompressible bilaterally 03/04/19 upon evaluation today patient has come in for reevaluation due to the fact that he had a significant right lower extremity cellulitis when he was seen in the office on Wednesday, two days ago. Subsequently he did not want to go to the ER for likely admission and I'll be anabiotic therapy. For that reason we actually have placed him when he saw Dr. Leanord Hawkingobson Wednesday on linezolid which he has been taking since that time. Subsequently the culture come back showing gram- negative rods and therefore Cipro was also recommended by Dr. Leanord Hawkingobson although that has not been seen in yet that was just this morning. The final culture and sensitivity has not returned yet. No fevers, chills, nausea, or vomiting noted at this time. As far as the markings around the leg actually appears that their theme a has receded somewhat compared to what it showed  during the evaluation today is ago and he also states that is not as tender to touch which is also good news. Electronic  Signature(s) Signed: 03/04/2019 5:06:58 PM By: Lenda KelpStone III, Kaley Jutras PA-C Entered By: Lenda KelpStone III, Kindall Swaby on 03/04/2019 09:59:55 Minturn, Tabius ([id number]) -------------------------------------------------------------------------------- Physical Exam Details Patient Name: [name] Walker, [name] Walker Date of Service: 03/04/2019 9:30 AM Medical Record Number: [id number] Patient Account Number: [id number] Date of Birth/Sex: September 07, 1976 (43 y.o. M) Treating RN: Curtis Sitesorthy, Joanna Primary Care Provider: Daniel NonesKLEIN, BERT Other Clinician: Referring Provider: Daniel NonesKLEIN, BERT Treating Provider/Extender: Linwood DibblesSTONE III, Ahana Najera Weeks in Treatment: 0 Constitutional Obese and well-hydrated in no acute distress. Respiratory normal breathing without difficulty. clear to auscultation bilaterally. Cardiovascular regular rate and rhythm with normal S1, S2. Psychiatric this patient is able to make decisions and demonstrates good insight into disease process. Alert and Oriented x 3. pleasant and cooperative. Notes Patient's lower extremity again show signs of weeping in multiple locations of open wounds and this is fairly extensive. With that being said the associated cellulitis seems to be improving albeit slightly at this time. Fortunately there's no signs of active infection systemically. I have discussed with him signs and symptoms to look for and if any of these occur he needs to go to the ER ASAP for further evaluation and treatment. He's voiced understanding. Electronic Signature(s) Signed: 03/04/2019 5:06:58 PM By: Lenda KelpStone III, Enmanuel Zufall PA-C Entered By: Lenda KelpStone III, Dallis Czaja on 03/04/2019 10:00:41 [name] Walker, [name] Walker ([id number]) -------------------------------------------------------------------------------- Physician Orders Details Patient Name: [name] Walker, [name] Walker Date of Service: 03/04/2019 9:30 AM Medical Record Number: [id number] Patient Account Number: [id number] Date of Birth/Sex: September 07, 1976 (43 y.o. M) Treating RN: Curtis Sitesorthy, Joanna Primary Care Provider: Daniel NonesKLEIN,  BERT Other Clinician: Referring Provider: Daniel NonesKLEIN, BERT Treating Provider/Extender: Linwood DibblesSTONE III, Bronwen Pendergraft Weeks in Treatment: 0 Verbal / Phone Orders: No Diagnosis Coding ICD-10 Coding Code Description L97.211 Non-pressure chronic ulcer of right calf limited to breakdown of skin L03.115 Cellulitis of right lower limb I87.311 Chronic venous hypertension (idiopathic) with ulcer of right lower extremity Wound Cleansing Wound #1 Right,Posterior Lower Leg o Dial antibacterial soap, wash wounds, rinse and pat dry prior to dressing wounds o May shower with protection. Anesthetic (add to Medication List) Wound #1 Right,Posterior Lower Leg o Topical Lidocaine 4% cream applied to wound bed prior to debridement (In Clinic Only). Primary Wound Dressing Wound #1 Right,Posterior Lower Leg o Silver Alginate Secondary Dressing Wound #1 Right,Posterior Lower Leg o ABD pad Dressing Change Frequency Wound #1 Right,Posterior Lower Leg o Change dressing every day. Follow-up Appointments Wound #1 Right,Posterior Lower Leg o Return Appointment in 1 week. o Return Appointment in: - Friday 03/07/2019 for re-check Edema Control Wound #1 Right,Posterior Lower Leg o Elevate legs to the level of the heart and pump ankles as often as possible Additional Orders / Instructions o Other: - Out of work until the 24th. Will re-evaluate at that time. Medications-please add to medication list. Wound #1 Right,Posterior Lower Leg [name] Walker, [name] Walker ([id number]) o P.O. Antibiotics Patient Medications Allergies: amoxicillin, Maxzide Notifications Medication Indication Start End Cipro 03/04/2019 DOSE 1 - oral 500 mg tablet - 1 tablet oral taken 2 times a day for 7 days. Take this along with the Linezolid Electronic Signature(s) Signed: 03/04/2019 10:02:45 AM By: Lenda KelpStone III, Cormick Moss PA-C Entered By: Lenda KelpStone III, Thurley Francesconi on 03/04/2019 10:02:45 [name] Walker, [name] Walker  ([id number]) -------------------------------------------------------------------------------- Problem List Details Patient Name: [name] Walker, Gurkaran Date of Service: 03/04/2019 9:30 AM Medical Record Number: [id number] Patient Account Number: [id number] Date of Birth/Sex: September 07, 1976 (43 y.o. M) Treating RN: Curtis Sitesorthy, Joanna Primary Care Provider:  Daniel Nones Other Clinician: Referring Provider: Daniel Nones Treating Provider/Extender: Linwood Dibbles, Bentlee Benningfield Weeks in Treatment: 0 Active Problems ICD-10 Evaluated Encounter Code Description Active Date Today Diagnosis L97.211 Non-pressure chronic ulcer of right calf limited to breakdown 03/02/2019 No Yes of skin L03.115 Cellulitis of right lower limb 03/02/2019 No Yes I87.311 Chronic venous hypertension (idiopathic) with ulcer of right 03/02/2019 No Yes lower extremity Inactive Problems Resolved Problems Electronic Signature(s) Signed: 03/04/2019 5:06:58 PM By: Lenda Kelp PA-C Entered By: Lenda Kelp on 03/04/2019 09:44:46 [name] Walker, [name] Walker ([id number]) -------------------------------------------------------------------------------- Progress Note Details Patient Name: [name] Walker, [name] Walker Date of Service: 03/04/2019 9:30 AM Medical Record Number: [id number] Patient Account Number: [id number] Date of Birth/Sex: 11-11-1975 (43 y.o. M) Treating RN: Curtis Sites Primary Care Provider: Daniel Nones Other Clinician: Referring Provider: Daniel Nones Treating Provider/Extender: Linwood Dibbles, Ruweyda Macknight Weeks in Treatment: 0 Subjective Chief Complaint Information obtained from Patient 03/02/2019; patient arrives here for review of a wound on the posterior right calf in the setting of severe cellulitis of the right leg History of Present Illness (HPI) ADMISSION 03/02/2019 This is a 43 year old man who is not a diabetic. He states he was well up until 8 days ago when he felt fatigued and generally unwell while at work. He went home and discovered intense erythema in the right  leg. This was painful. He did not make it into see his doctor until 3 days later. He was put on doxycycline. He had a duplex ultrasound done at [facility]al [facility] that did not show reflux, DVT or superficial thrombophlebitis. He was put on doxycycline which she is still taking. He was put in an Radio broadcast assistant. He was reviewed again 2 days ago and referred here. He had an Radio broadcast assistant on when he came in here today. Patient states he is not systemically unwell but the leg is uncomfortable. He does not have a history of wounds on his legs. He does have some discoloration of the left leg which looks like chronic venous changes however he states that the right leg did not have this prior to any changes recently. I note that he has been seen by Dr. Graciela Husbands at the [facility] worked up for lower extremity edema. He had an echocardiogram. Past medical history includes hypertension, edema of both legs, obstructive sleep apnea, morbid obesity. Allergies, he is listed as being allergic to amoxicillin from 2015 and his primary doctor's office although I do not see this in epic. The patient does not remember anything about this. He does not remember being on amoxicillin. Socially; he works in a Education officer, community. But he denies getting his leg into contact with anything particularly problematic ABIs in our clinic were noncompressible bilaterally 03/04/19 upon evaluation today patient has come in for reevaluation due to the fact that he had a significant right lower extremity cellulitis when he was seen in the office on Wednesday, two days ago. Subsequently he did not want to go to the ER for likely admission and I'll be anabiotic therapy. For that reason we actually have placed him when he saw Dr. Leanord Hawking Wednesday on linezolid which he has been taking since that time. Subsequently the culture come back showing gram- negative rods and therefore Cipro was also recommended by Dr. Leanord Hawking although that has not  been seen in yet that was just this morning. The final culture and sensitivity has not returned yet. No fevers, chills, nausea, or vomiting noted at this time. As far as the markings around the leg actually appears  that their theme a has receded somewhat compared to what it showed during the evaluation today is ago and he also states that is not as tender to touch which is also good news. Patient History Information obtained from Patient. Family History Cancer - Maternal Grandparents, Diabetes - Mother,Father, Heart Disease - Father, Hypertension - Father,Mother, Thyroid Problems - Mother, No family history of Hereditary Spherocytosis, Kidney Disease, Lung Disease, Seizures, Stroke, Tuberculosis. Social History Cybulski, Keveon ([id number]) Never smoker, Alcohol Use - Never, Drug Use - No History, Caffeine Use - Daily. Medical History Eyes Denies history of Cataracts, Glaucoma, Optic Neuritis Ear/Nose/Mouth/Throat Denies history of Chronic sinus problems/congestion, Middle ear problems Hematologic/Lymphatic Denies history of Anemia, Hemophilia, Human Immunodeficiency Virus, Lymphedema, Sickle Cell Disease Cardiovascular Patient has history of Hypertension Denies history of Angina, Arrhythmia, Congestive Heart Failure, Coronary Artery Disease, Deep Vein Thrombosis, Hypotension, Myocardial Infarction, Peripheral Arterial Disease, Peripheral Venous Disease, Phlebitis, Vasculitis Gastrointestinal Denies history of Cirrhosis , Colitis, Crohn s, Hepatitis A, Hepatitis B, Hepatitis C Endocrine Denies history of Type I Diabetes, Type II Diabetes Genitourinary Denies history of End Stage Renal Disease Immunological Denies history of Lupus Erythematosus, Raynaud s, Scleroderma Integumentary (Skin) Denies history of History of Burn, History of pressure wounds Musculoskeletal Denies history of Gout, Rheumatoid Arthritis, Osteoarthritis, Osteomyelitis Oncologic Denies history of Received  Chemotherapy, Received Radiation Psychiatric Denies history of Anorexia/bulimia, Confinement Anxiety Medical And Surgical History Notes Respiratory Sleep apnea on CPAP Review of Systems (ROS) Constitutional Symptoms (General Health) Denies complaints or symptoms of Fatigue, Fever, Chills, Marked Weight Change. Respiratory Denies complaints or symptoms of Chronic or frequent coughs, Shortness of Breath. Cardiovascular Complains or has symptoms of LE edema. Denies complaints or symptoms of Chest pain. Psychiatric Denies complaints or symptoms of Anxiety, Claustrophobia. Objective Constitutional Obese and well-hydrated in no acute distress. Vitals Time Taken: 9:29 AM, Height: 66 in, Weight: 442 lbs, BMI: 71.3, Temperature: 98.1 F, Pulse: 81 bpm, Respiratory [name] Walker, [name] Walker ([id number]) Rate: 18 breaths/min, Blood Pressure: 147/76 mmHg. Respiratory normal breathing without difficulty. clear to auscultation bilaterally. Cardiovascular regular rate and rhythm with normal S1, S2. Psychiatric this patient is able to make decisions and demonstrates good insight into disease process. Alert and Oriented x 3. pleasant and cooperative. General Notes: Patient's lower extremity again show signs of weeping in multiple locations of open wounds and this is fairly extensive. With that being said the associated cellulitis seems to be improving albeit slightly at this time. Fortunately there's no signs of active infection systemically. I have discussed with him signs and symptoms to look for and if any of these occur he needs to go to the ER ASAP for further evaluation and treatment. He's voiced understanding. Integumentary (Hair, Skin) Wound #1 status is Open. Original cause of wound was Gradually Appeared. The wound is located on the Right,Posterior Lower Leg. The wound measures 19cm length x 27cm width x 0.1cm depth; 402.909cm^2 area and 40.291cm^3 volume. There is Fat Layer (Subcutaneous Tissue)  Exposed exposed. There is no tunneling or undermining noted. There is a large amount of serous drainage noted. The wound margin is flat and intact. There is small (1-33%) pale granulation within the wound bed. There is a large (67-100%) amount of necrotic tissue within the wound bed including Adherent Slough. Assessment Active Problems ICD-10 Non-pressure chronic ulcer of right calf limited to breakdown of skin Cellulitis of right lower limb Chronic venous hypertension (idiopathic) with ulcer of right lower extremity Plan Wound Cleansing: Wound #1 Right,Posterior Lower Leg: Dial antibacterial soap, wash wounds,  rinse and pat dry prior to dressing wounds May shower with protection. Anesthetic (add to Medication List): Wound #1 Right,Posterior Lower Leg: Topical Lidocaine 4% cream applied to wound bed prior to debridement (In Clinic Only). Primary Wound Dressing: Wound #1 Right,Posterior Lower Leg: Silver Alginate Secondary Dressing: Wound #1 Right,Posterior Lower Leg: ABD pad [name] Walker, Trayton ([id number]) Dressing Change Frequency: Wound #1 Right,Posterior Lower Leg: Change dressing every day. Follow-up Appointments: Wound #1 Right,Posterior Lower Leg: Return Appointment in 1 week. Return Appointment in: - Friday 03/07/2019 for re-check Edema Control: Wound #1 Right,Posterior Lower Leg: Elevate legs to the level of the heart and pump ankles as often as possible Additional Orders / Instructions: Other: - Out of work until the 24th. Will re-evaluate at that time. Medications-please add to medication list.: Wound #1 Right,Posterior Lower Leg: P.O. Antibiotics The following medication(s) was prescribed: Cipro oral 500 mg tablet 1 1 tablet oral taken 2 times a day for 7 days. Take this along with the Linezolid starting 03/04/2019 For the time being we will continue with the above wound care measures for the next week and the patient is in agreement with that plan. Subsequently we're  gonna see were things stand at follow-up on Monday and then subsequently I'll have him follow back up with Dr. Leanord Hawking Wednesday at which point he can likely initiate weekly visits if everything is still doing well at that time. The patient is in agreement with that plan. I did add Cipro to the treatment regimen per Dr. Jannetta Quint recommendation based on the preliminary review of the culture today. We will see what the final culture shows. In the meantime I did recommend that he go and take one of these today as soon as he gets it and then the second this evening and then get on a every 12 hours schedule starting tomorrow. He is in agreement with this plan. I will see were things stand on Monday hopefully he will be doing significantly better. We will then subsequently get back on a normal schedule to see Dr. Leanord Hawking on Wednesday. Please see above for specific wound care orders. We will see patient for re-evaluation in 1 week(s) here in the clinic. If anything worsens or changes patient will contact our office for additional recommendations. Electronic Signature(s) Signed: 03/04/2019 5:06:58 PM By: Lenda Kelp PA-C Entered By: Lenda Kelp on 03/04/2019 10:03:33 Prabhakar, Jerron ([id number]) -------------------------------------------------------------------------------- ROS/PFSH Details Patient Name: [name] Bombard Date of Service: 03/04/2019 9:30 AM Medical Record Number: [id number] Patient Account Number: [id number] Date of Birth/Sex: 03/25/1976 (43 y.o. M) Treating RN: Curtis Sites Primary Care Provider: Daniel Nones Other Clinician: Referring Provider: Daniel Nones Treating Provider/Extender: Linwood Dibbles, Dyron Kawano Weeks in Treatment: 0 Information Obtained From Patient Constitutional Symptoms (General Health) Complaints and Symptoms: Negative for: Fatigue; Fever; Chills; Marked Weight Change Respiratory Complaints and Symptoms: Negative for: Chronic or frequent coughs; Shortness of  Breath Medical History: Past Medical History Notes: Sleep apnea on CPAP Cardiovascular Complaints and Symptoms: Positive for: LE edema Negative for: Chest pain Medical History: Positive for: Hypertension Negative for: Angina; Arrhythmia; Congestive Heart Failure; Coronary Artery Disease; Deep Vein Thrombosis; Hypotension; Myocardial Infarction; Peripheral Arterial Disease; Peripheral Venous Disease; Phlebitis; Vasculitis Psychiatric Complaints and Symptoms: Negative for: Anxiety; Claustrophobia Medical History: Negative for: Anorexia/bulimia; Confinement Anxiety Eyes Medical History: Negative for: Cataracts; Glaucoma; Optic Neuritis Ear/Nose/Mouth/Throat Medical History: Negative for: Chronic sinus problems/congestion; Middle ear problems Hematologic/Lymphatic Medical History: Negative for: Anemia; Hemophilia; Human Immunodeficiency Virus; Lymphedema; Sickle Cell Disease Nearhood, Lilton ([id number]) Gastrointestinal Medical History:  Negative for: Cirrhosis ; Colitis; Crohnos; Hepatitis A; Hepatitis B; Hepatitis C Endocrine Medical History: Negative for: Type I Diabetes; Type II Diabetes Genitourinary Medical History: Negative for: End Stage Renal Disease Immunological Medical History: Negative for: Lupus Erythematosus; Raynaudos; Scleroderma Integumentary (Skin) Medical History: Negative for: History of Burn; History of pressure wounds Musculoskeletal Medical History: Negative for: Gout; Rheumatoid Arthritis; Osteoarthritis; Osteomyelitis Oncologic Medical History: Negative for: Received Chemotherapy; Received Radiation Immunizations Pneumococcal Vaccine: Received Pneumococcal Vaccination: No Implantable Devices None Family and Social History Cancer: Yes - Maternal Grandparents; Diabetes: Yes - Mother,Father; Heart Disease: Yes - Father; Hereditary Spherocytosis: No; Hypertension: Yes - Father,Mother; Kidney Disease: No; Lung Disease: No; Seizures: No; Stroke:  No; Thyroid Problems: Yes - Mother; Tuberculosis: No; Never smoker; Alcohol Use: Never; Drug Use: No History; Caffeine Use: Daily; Financial Concerns: No; Food, Clothing or Shelter Needs: No; Support System Lacking: No; Transportation Concerns: No Physician Affirmation I have reviewed and agree with the above information. Electronic Signature(s) Signed: 03/04/2019 5:01:18 PM By: Curtis Sitesorthy, Joanna Signed: 03/04/2019 5:06:58 PM By: Lenda KelpStone III, Toleen Lachapelle PA-C Entered By: Lenda KelpStone III, Renald Haithcock on 03/04/2019 10:00:21 Roll, Romelo ([id number]) Pavon, Xxavier ([id number]) -------------------------------------------------------------------------------- SuperBill Details Patient Name: [name] Walker, Ryland Date of Service: 03/04/2019 Medical Record Number: [id number] Patient Account Number: [id number] Date of Birth/Sex: February 21, 1976 (43 y.o. M) Treating RN: Curtis Sitesorthy, Joanna Primary Care Provider: Daniel NonesKLEIN, BERT Other Clinician: Referring Provider: Daniel NonesKLEIN, BERT Treating Provider/Extender: Linwood DibblesSTONE III, Ladell Lea Weeks in Treatment: 0 Diagnosis Coding ICD-10 Codes Code Description L97.211 Non-pressure chronic ulcer of right calf limited to breakdown of skin L03.115 Cellulitis of right lower limb I87.311 Chronic venous hypertension (idiopathic) with ulcer of right lower extremity Facility Procedures CPT4 Code: [id number] Description: 99213 - WOUND CARE VISIT-LEV 3 EST PT Modifier: Quantity: 1 Physician Procedures CPT4 Code: [id number] Description: 99214 - WC PHYS LEVEL 4 - EST PT ICD-10 Diagnosis Description L97.211 Non-pressure chronic ulcer of right calf limited to breakdown L03.115 Cellulitis of right lower limb I87.311 Chronic venous hypertension (idiopathic) with ulcer of right Modifier: of skin lower extremity Quantity: 1 Electronic Signature(s) Signed: 03/04/2019 5:06:58 PM By: Lenda KelpStone III, Kemontae Dunklee PA-C Entered By: Lenda KelpStone III, Afsheen Antony on 03/04/2019 10:03:45

## 2019-03-07 ENCOUNTER — Other Ambulatory Visit: Payer: Self-pay

## 2019-03-07 DIAGNOSIS — I10 Essential (primary) hypertension: Secondary | ICD-10-CM | POA: Diagnosis not present

## 2019-03-07 DIAGNOSIS — G4733 Obstructive sleep apnea (adult) (pediatric): Secondary | ICD-10-CM | POA: Diagnosis not present

## 2019-03-07 DIAGNOSIS — Z8249 Family history of ischemic heart disease and other diseases of the circulatory system: Secondary | ICD-10-CM | POA: Diagnosis not present

## 2019-03-07 DIAGNOSIS — I872 Venous insufficiency (chronic) (peripheral): Secondary | ICD-10-CM | POA: Diagnosis not present

## 2019-03-07 DIAGNOSIS — L03115 Cellulitis of right lower limb: Secondary | ICD-10-CM | POA: Diagnosis not present

## 2019-03-07 DIAGNOSIS — Z809 Family history of malignant neoplasm, unspecified: Secondary | ICD-10-CM | POA: Diagnosis not present

## 2019-03-07 DIAGNOSIS — Z880 Allergy status to penicillin: Secondary | ICD-10-CM | POA: Diagnosis not present

## 2019-03-07 DIAGNOSIS — I890 Lymphedema, not elsewhere classified: Secondary | ICD-10-CM | POA: Diagnosis not present

## 2019-03-07 DIAGNOSIS — Z6841 Body Mass Index (BMI) 40.0 and over, adult: Secondary | ICD-10-CM | POA: Diagnosis not present

## 2019-03-07 DIAGNOSIS — Z881 Allergy status to other antibiotic agents status: Secondary | ICD-10-CM | POA: Diagnosis not present

## 2019-03-07 DIAGNOSIS — I87311 Chronic venous hypertension (idiopathic) with ulcer of right lower extremity: Secondary | ICD-10-CM | POA: Diagnosis not present

## 2019-03-07 DIAGNOSIS — Z8349 Family history of other endocrine, nutritional and metabolic diseases: Secondary | ICD-10-CM | POA: Diagnosis not present

## 2019-03-07 DIAGNOSIS — L97211 Non-pressure chronic ulcer of right calf limited to breakdown of skin: Secondary | ICD-10-CM | POA: Diagnosis not present

## 2019-03-08 ENCOUNTER — Other Ambulatory Visit: Payer: Self-pay

## 2019-03-08 ENCOUNTER — Ambulatory Visit (INDEPENDENT_AMBULATORY_CARE_PROVIDER_SITE_OTHER): Payer: BC Managed Care – PPO | Admitting: Vascular Surgery

## 2019-03-08 ENCOUNTER — Encounter (INDEPENDENT_AMBULATORY_CARE_PROVIDER_SITE_OTHER): Payer: Self-pay | Admitting: Vascular Surgery

## 2019-03-08 DIAGNOSIS — M7989 Other specified soft tissue disorders: Secondary | ICD-10-CM

## 2019-03-08 DIAGNOSIS — Z79899 Other long term (current) drug therapy: Secondary | ICD-10-CM

## 2019-03-08 DIAGNOSIS — I10 Essential (primary) hypertension: Secondary | ICD-10-CM | POA: Diagnosis not present

## 2019-03-08 DIAGNOSIS — L97211 Non-pressure chronic ulcer of right calf limited to breakdown of skin: Secondary | ICD-10-CM | POA: Diagnosis not present

## 2019-03-08 NOTE — Assessment & Plan Note (Signed)
This is clearly a major contributing factor to his lower extremity swelling and weight loss would be of huge importance in reducing the swelling.

## 2019-03-08 NOTE — Assessment & Plan Note (Signed)
blood pressure control important in reducing the progression of atherosclerotic disease. On appropriate oral medications.  

## 2019-03-08 NOTE — Assessment & Plan Note (Signed)
The patient has very prominent leg swelling that is likely multifactorial in origin.  Given his large size, leg swelling is going to be expected and less he loses a significant amount of weight.  He clearly has lymphedema from chronic scarring and lymphatic channels and the lymphedema pump would be of benefit.  He has had a negative DVT study, but a true venous reflux study should be done as well.  Elevation, compression, and increasing activity would all be of great benefit for improving his leg swelling.  Given his size, this is going to be very long and slow process particularly given the large wound present on the right leg.

## 2019-03-08 NOTE — Progress Notes (Signed)
[name] [name] ([id number]) Visit Report for 03/07/2019 Arrival Information Details Patient Name: [name] [name] Date of Service: 03/07/2019 3:00 PM Medical Record Number: [id number] Patient Account Number: [id number] Date of Birth/Sex: 01-Sep-1976 (43 y.o. M) Treating RN: Cornell Barman Primary Care Ulyssa Walthour: Ramonita Lab Other Clinician: Referring Kamila Broda: Ramonita Lab Treating Alexiz Sustaita/Extender: Melburn Hake, HOYT Weeks in Treatment: 0 Visit Information History Since Last Visit Added or deleted any medications: No Patient Arrived: Ambulatory Any new allergies or adverse reactions: No Arrival Time: 15:08 Had a fall or experienced change in No Accompanied By: self activities of daily living that may affect Transfer Assistance: None risk of falls: Patient Identification Verified: Yes Signs or symptoms of abuse/neglect since last visito No Secondary Verification Process Yes Hospitalized since last visit: No Completed: Implantable device outside of the clinic excluding No Patient Has Alerts: Yes cellular tissue based products placed in the center Patient Alerts: Non- since last visit: compressible Has Dressing in Place as Prescribed: Yes Pain Present Now: No Electronic Signature(s) Signed: 03/07/2019 4:23:08 PM By: Gretta Cool, BSN, RN, CWS, Kim RN, BSN Entered By: Gretta Cool, BSN, RN, CWS, Kim on 03/07/2019 15:12:39 [name] [name] ([id number]) -------------------------------------------------------------------------------- Clinic Level of Care Assessment Details Patient Name: [name] [name] Date of Service: 03/07/2019 3:00 PM Medical Record Number: [id number] Patient Account Number: [id number] Date of Birth/Sex: 27-Jul-1976 (43 y.o. M) Treating RN: Cornell Barman Primary Care Amilio Zehnder: Ramonita Lab Other Clinician: Referring Kaylina Cahue: Ramonita Lab Treating Kanesha Cadle/Extender: Melburn Hake, HOYT Weeks in Treatment: 0 Clinic Level of Care Assessment Items TOOL 4 Quantity Score []  - Use when only an EandM is  performed on FOLLOW-UP visit 0 ASSESSMENTS - Nursing Assessment / Reassessment []  - Reassessment of Co-morbidities (includes updates in patient status) 0 []  - 0 Reassessment of Adherence to Treatment Plan ASSESSMENTS - Wound and Skin Assessment / Reassessment X - Simple Wound Assessment / Reassessment - one wound 1 5 []  - 0 Complex Wound Assessment / Reassessment - multiple wounds []  - 0 Dermatologic / Skin Assessment (not related to wound area) ASSESSMENTS - Focused Assessment []  - Circumferential Edema Measurements - multi extremities 0 []  - 0 Nutritional Assessment / Counseling / Intervention []  - 0 Lower Extremity Assessment (monofilament, tuning fork, pulses) []  - 0 Peripheral Arterial Disease Assessment (using hand held doppler) ASSESSMENTS - Ostomy and/or Continence Assessment and Care []  - Incontinence Assessment and Management 0 []  - 0 Ostomy Care Assessment and Management (repouching, etc.) PROCESS - Coordination of Care X - Simple Patient / Family Education for ongoing care 1 15 []  - 0 Complex (extensive) Patient / Family Education for ongoing care []  - 0 Staff obtains Programmer, systems, Records, Test Results / Process Orders []  - 0 Staff telephones HHA, Nursing Homes / Clarify orders / etc []  - 0 Routine Transfer to another Facility (non-emergent condition) []  - 0 Routine Hospital Admission (non-emergent condition) []  - 0 New Admissions / Biomedical engineer / Ordering NPWT, Apligraf, etc. []  - 0 Emergency Hospital Admission (emergent condition) X- 1 10 Simple Discharge Coordination [name] [name] ([id number]) []  - 0 Complex (extensive) Discharge Coordination PROCESS - Special Needs []  - Pediatric / Minor Patient Management 0 []  - 0 Isolation Patient Management []  - 0 Hearing / Language / Visual special needs []  - 0 Assessment of Community assistance (transportation, D/C planning, etc.) []  - 0 Additional assistance / Altered mentation []  - 0 Support  Surface(s) Assessment (bed, cushion, seat, etc.) INTERVENTIONS - Wound Cleansing / Measurement X - Simple Wound Cleansing - one wound 1 5 []  - 0 Complex  Wound Cleansing - multiple wounds X- 1 5 Wound Imaging (photographs - any number of wounds) []  - 0 Wound Tracing (instead of photographs) X- 1 5 Simple Wound Measurement - one wound []  - 0 Complex Wound Measurement - multiple wounds INTERVENTIONS - Wound Dressings []  - Small Wound Dressing one or multiple wounds 0 X- 1 15 Medium Wound Dressing one or multiple wounds []  - 0 Large Wound Dressing one or multiple wounds []  - 0 Application of Medications - topical []  - 0 Application of Medications - injection INTERVENTIONS - Miscellaneous []  - External ear exam 0 []  - 0 Specimen Collection (cultures, biopsies, blood, body fluids, etc.) []  - 0 Specimen(s) / Culture(s) sent or taken to Lab for analysis []  - 0 Patient Transfer (multiple staff / Nurse, adultHoyer Lift / Similar devices) []  - 0 Simple Staple / Suture removal (25 or less) []  - 0 Complex Staple / Suture removal (26 or more) []  - 0 Hypo / Hyperglycemic Management (close monitor of Blood Glucose) []  - 0 Ankle / Brachial Index (ABI) - do not check if billed separately []  - 0 Vital Signs [name] [name] ([id number]) Has the patient been seen at the hospital within the last three years: Yes Total Score: 60 Level Of Care: New/Established - Level 2 Electronic Signature(s) Signed: 03/07/2019 4:23:08 PM By: Elliot GurneyWoody, BSN, RN, CWS, Kim RN, BSN Entered By: Elliot GurneyWoody, BSN, RN, CWS, Kim on 03/07/2019 15:40:16 [name] [name] ([id number]) -------------------------------------------------------------------------------- Encounter Discharge Information Details Patient Name: [name] [name] Date of Service: 03/07/2019 3:00 PM Medical Record Number: [id number] Patient Account Number: [id number] Date of Birth/Sex: 04/12/1976 (43 y.o. M) Treating RN: Huel CoventryWoody, Kim Primary Care Antoneo Ghrist: Daniel NonesKLEIN, BERT Other  Clinician: Referring Eldrige Pitkin: Daniel NonesKLEIN, BERT Treating Mayerli Kirst/Extender: Linwood DibblesSTONE III, HOYT Weeks in Treatment: 0 Encounter Discharge Information Items Discharge Condition: Stable Ambulatory Status: Ambulatory Discharge Destination: Home Transportation: Private Auto Accompanied By: self Schedule Follow-up Appointment: Yes Clinical Summary of Care: Electronic Signature(s) Signed: 03/07/2019 4:23:08 PM By: Elliot GurneyWoody, BSN, RN, CWS, Kim RN, BSN Entered By: Elliot GurneyWoody, BSN, RN, CWS, Kim on 03/07/2019 15:39:37 [name] [name] ([id number]) -------------------------------------------------------------------------------- Wound Assessment Details Patient Name: [name] RepressSHAFFER, [name] Date of Service: 03/07/2019 3:00 PM Medical Record Number: [id number] Patient Account Number: [id number] Date of Birth/Sex: 04/12/1976 (43 y.o. M) Treating RN: Huel CoventryWoody, Kim Primary Care Twanisha Foulk: Daniel NonesKLEIN, BERT Other Clinician: Referring Arick Mareno: Daniel NonesKLEIN, BERT Treating Quinlynn Cuthbert/Extender: Linwood DibblesSTONE III, HOYT Weeks in Treatment: 0 Wound Status Wound Number: 1 Primary Etiology: Lymphedema Wound Location: Right Lower Leg - Posterior Secondary Etiology: Venous Leg Ulcer Wounding Event: Gradually Appeared Wound Status: Open Date Acquired: 02/24/2019 Comorbid History: Hypertension Weeks Of Treatment: 0 Clustered Wound: No Photos Wound Measurements Length: (cm) 17 Width: (cm) 27 Depth: (cm) 0.1 Area: (cm) 360.498 Volume: (cm) 36.05 % Reduction in Area: -0.7% % Reduction in Volume: -0.7% Epithelialization: None Wound Description Full Thickness Without Exposed Support Classification: Structures Wound Margin: Flat and Intact Exudate Large Amount: Exudate Type: Serous Exudate Color: amber Foul Odor After Cleansing: No Slough/Fibrino Yes Wound Bed Granulation Amount: Small (1-33%) Exposed Structure Granulation Quality: Pale Fascia Exposed: No Necrotic Amount: Large (67-100%) Fat Layer (Subcutaneous Tissue) Exposed: Yes Necrotic  Quality: Adherent Slough Tendon Exposed: No Muscle Exposed: No Joint Exposed: No Bone Exposed: No [name] [name] ([id number]) Treatment Notes Wound #1 (Right, Posterior Lower Leg) Notes silvercel, abd, kerlix. Paient refused tubigrip. Electronic Signature(s) Signed: 03/07/2019 4:23:08 PM By: Elliot GurneyWoody, BSN, RN, CWS, Kim RN, BSN Entered By: Elliot GurneyWoody, BSN, RN, CWS, Kim on 03/07/2019 15:21:25

## 2019-03-08 NOTE — Patient Instructions (Signed)

## 2019-03-08 NOTE — Assessment & Plan Note (Signed)
He is already being seen by the wound care center or I would wrap him in Unna boots today.  I suspect he will need Unna boots for months to get this large wound healed.

## 2019-03-08 NOTE — Progress Notes (Signed)
Patient ID: [name] Bombarded Higbie, male   DOB: [birth year], 43 y.o.   MRN: [id number]  Chief Complaint  Patient presents with  . New Patient (Initial Visit)    HPI [name] [name] is a [age] y.o. male.  I am asked to see the patient by Dr. Graciela HusbandsKlein for evaluation of leg swelling.  The appointment was originally made a few weeks ago for prominent left leg swelling.  2 weeks ago, his right leg became very red and swollen and he developed a very large wound on the right leg.  This encompasses most of the posterior aspect of the calf and goes around to the lateral aspect of the calf.  He has been draining clear serous fluid.  The redness and the tenderness have gotten better with antibiotics but have not resolved.  The swelling remains very prominent on both legs.  He says the pain he had with the cellulitis episode was extremely severe and may be the worst pain he has ever had.  No fevers or chills.  No left leg ulceration.  There is not a clear inciting event or causative factor that started the symptoms.  He is up on his legs a lot when he works and his legs are extremely tired at the end of the day.     Past Medical History:  Diagnosis Date  . Hypertension   . Sleep apnea     Past Surgical History:  Procedure Laterality Date  . HERNIA REPAIR      Family History No bleeding disorders, no clotting disorders, no autoimmune diseases, no aneurysms  Social History Social History   Tobacco Use  . Smoking status: Never Smoker  . Smokeless tobacco: Never Used  Substance Use Topics  . Alcohol use: Yes    Alcohol/week: 24.0 standard drinks    Types: 24 Cans of beer per week  . Drug use: Not Currently    No Known Allergies  Current Outpatient Medications  Medication Sig Dispense Refill  . ciprofloxacin (CIPRO) 500 MG tablet TAKE 1 TABLET BY MOUTH TWICE DAILY FOR 7 DAYS (TAKE THIS ALONG WITH LINEZOLID)    . furosemide (LASIX) 40 MG tablet TAKE 1 TABLET BY MOUTH ONCE DAILY AS NEEDED FOR EDEMA    .  linezolid (ZYVOX) 600 MG tablet Take 600 mg by mouth 2 (two) times daily. for 7 days    . potassium chloride (K-DUR) 10 MEQ tablet Take by mouth.    . telmisartan (MICARDIS) 40 MG tablet Take 40 mg by mouth daily.    . benazepril-hydrochlorthiazide (LOTENSIN HCT) 20-25 MG per tablet Take 2 tablets by mouth daily.    Marland Kitchen. testosterone cypionate (DEPOTESTOTERONE CYPIONATE) 200 MG/ML injection Inject 200 mg into the muscle every 7 (seven) days.     No current facility-administered medications for this visit.       REVIEW OF SYSTEMS (Negative unless checked)  Constitutional: [] Weight loss  [] Fever  [] Chills Cardiac: [] Chest pain   [] Chest pressure   [] Palpitations   [] Shortness of breath when laying flat   [] Shortness of breath at rest   [] Shortness of breath with exertion. Vascular:  [] Pain in legs with walking   [x] Pain in legs at rest   [] Pain in legs when laying flat   [] Claudication   [] Pain in feet when walking  [] Pain in feet at rest  [] Pain in feet when laying flat   [] History of DVT   [] Phlebitis   [x] Swelling in legs   [] Varicose veins   [x] Non-healing ulcers Pulmonary:   []   Uses home oxygen   [] Productive cough   [] Hemoptysis   [] Wheeze  [] COPD   [] Asthma Neurologic:  [] Dizziness  [] Blackouts   [] Seizures   [] History of stroke   [] History of TIA  [] Aphasia   [] Temporary blindness   [] Dysphagia   [] Weakness or numbness in arms   [] Weakness or numbness in legs Musculoskeletal:  [x] Arthritis   [] Joint swelling   [x] Joint pain   [] Low back pain Hematologic:  [] Easy bruising  [] Easy bleeding   [] Hypercoagulable state   [] Anemic  [] Hepatitis Gastrointestinal:  [] Blood in stool   [] Vomiting blood  [] Gastroesophageal reflux/heartburn   [] Abdominal pain Genitourinary:  [] Chronic kidney disease   [] Difficult urination  [] Frequent urination  [] Burning with urination   [] Hematuria Skin:  [] Rashes   [x] Ulcers   [x] Wounds Psychological:  [] History of anxiety   []  History of major depression.     Physical Exam BP (!) 163/85 (BP Location: Left Wrist, Patient Position: Sitting, Cuff Size: Large)   Pulse 94   Resp 14   Ht 5\' 6"  (1.676 m)   Wt (!) 436 lb (197.8 kg)   BMI 70.37 kg/m  Gen:  WD/WN, NAD. Massively obese.  Head: National/AT, No temporalis wasting.  Ear/Nose/Throat: Hearing grossly intact, nares w/o erythema or drainage, oropharynx w/o Erythema/Exudate Eyes: Conjunctiva clear, sclera non-icteric  Neck: trachea midline.  No JVD.  Pulmonary:  Good air movement, respirations not labored, no use of accessory muscles  Cardiac: RRR, no JVD Vascular:  Vessel Right Left  Radial Palpable Palpable                          DP NP NP  PT NP NP   Gastrointestinal:. No masses, surgical incisions, or scars. Musculoskeletal: M/S 5/5 throughout.  Extremities without ischemic changes.  No deformity or atrophy.  Very large wound on the posterior aspect wrapping around the lateral aspect of the right calf.  This is relatively superficial but weeping clear serous fluid.  Moderate surrounding erythema.  3+ bilateral lower extremity edema. Neurologic: Sensation grossly intact in extremities.  Symmetrical.  Speech is fluent. Motor exam as listed above. Psychiatric: Judgment intact, Mood & affect appropriate for pt's clinical situation. Dermatologic: Right calf wound as above    Radiology [place] Venous Img Lower Unilateral Right  Result Date: 02/25/2019 CLINICAL DATA:  Right lower extremity pain and edema. EXAM: RIGHT LOWER EXTREMITY VENOUS DOPPLER ULTRASOUND TECHNIQUE: Gray-scale sonography with graded compression, as well as color Doppler and duplex ultrasound were performed to evaluate the lower extremity deep venous systems from the level of the common femoral vein and including the common femoral, femoral, profunda femoral, popliteal and calf veins including the posterior tibial, peroneal and gastrocnemius veins when visible. The superficial great saphenous vein was also interrogated. Spectral  Doppler was utilized to evaluate flow at rest and with distal augmentation maneuvers in the common femoral, femoral and popliteal veins. COMPARISON:  None. FINDINGS: Contralateral Common Femoral Vein: Respiratory phasicity is normal and symmetric with the symptomatic side. No evidence of thrombus. Normal compressibility. Common Femoral Vein: No evidence of thrombus. Normal compressibility, respiratory phasicity and response to augmentation. Saphenofemoral Junction: No evidence of thrombus. Normal compressibility and flow on color Doppler imaging. Profunda Femoral Vein: No evidence of thrombus. Normal compressibility and flow on color Doppler imaging. Femoral Vein: No evidence of thrombus. Normal compressibility, respiratory phasicity and response to augmentation. Popliteal Vein: No evidence of thrombus. Normal compressibility, respiratory phasicity and response to augmentation. Calf Veins: No  evidence of thrombus. Normal compressibility and flow on color Doppler imaging. Superficial Great Saphenous Vein: No evidence of thrombus. Normal compressibility. Venous Reflux:  None. Other Findings: No evidence of superficial thrombophlebitis or abnormal fluid collection. IMPRESSION: No evidence of right lower extremity deep venous thrombosis. Electronically Signed   By: Aletta Edouard M.D.   On: 02/25/2019 17:23    Labs Recent Results (from the past 2160 hour(s))  Aerobic Culture (superficial specimen)     Status: None   Collection Time: 03/03/19  2:00 PM   Specimen: Wound  Result Value Ref Range   Specimen Description      WOUND Performed at [facility], [address] [place], [place] [address]    Special Requests      RIGHT LEG Performed at [facility], [address] [place], [place] [address]    Gram Stain      RARE WBC PRESENT, PREDOMINANTLY PMN MODERATE GRAM NEGATIVE RODS Performed at [facility], [place] [address] [place], [place] [address]    Culture       ABUNDANT PSEUDOMONAS AERUGINOSA ABUNDANT SERRATIA MARCESCENS    Report Status 03/05/2019 FINAL    Organism ID, Bacteria PSEUDOMONAS AERUGINOSA    Organism ID, Bacteria SERRATIA MARCESCENS       Susceptibility   Pseudomonas aeruginosa - MIC*    CEFTAZIDIME 4 SENSITIVE Sensitive     CIPROFLOXACIN <=0.25 SENSITIVE Sensitive     GENTAMICIN <=1 SENSITIVE Sensitive     IMIPENEM 1 SENSITIVE Sensitive     PIP/TAZO 8 SENSITIVE Sensitive     CEFEPIME <=1 SENSITIVE Sensitive     * ABUNDANT PSEUDOMONAS AERUGINOSA   Serratia marcescens - MIC*    CEFAZOLIN >=64 RESISTANT Resistant     CEFEPIME <=1 SENSITIVE Sensitive     CEFTAZIDIME <=1 SENSITIVE Sensitive     CEFTRIAXONE <=1 SENSITIVE Sensitive     CIPROFLOXACIN <=0.25 SENSITIVE Sensitive     GENTAMICIN <=1 SENSITIVE Sensitive     TRIMETH/SULFA <=20 SENSITIVE Sensitive     * ABUNDANT SERRATIA MARCESCENS    Assessment/Plan:  Essential hypertension blood pressure control important in reducing the progression of atherosclerotic disease. On appropriate oral medications.   Morbid obesity ([place]) This is clearly a major contributing factor to his lower extremity swelling and weight loss would be of huge importance in reducing the swelling.  Lower limb ulcer, calf, right, limited to breakdown of skin [facility]) He is already being seen by the wound care center or I would wrap him in Unna boots today.  I suspect he will need Unna boots for months to get this large wound healed.  Swelling of limb The patient has very prominent leg swelling that is likely multifactorial in origin.  Given his large size, leg swelling is going to be expected and less he loses a significant amount of weight.  He clearly has lymphedema from chronic scarring and lymphatic channels and the lymphedema pump would be of benefit.  He has had a negative DVT study, but a true venous reflux study should be done as well.  Elevation, compression, and increasing activity would all be of  great benefit for improving his leg swelling.  Given his size, this is going to be very long and slow process particularly given the large wound present on the right leg.      [name] 03/08/2019, 3:00 PM   This note was created with Dragon medical transcription system.  Any errors from dictation are unintentional.

## 2019-03-09 ENCOUNTER — Encounter: Payer: BC Managed Care – PPO | Admitting: Internal Medicine

## 2019-03-09 DIAGNOSIS — I890 Lymphedema, not elsewhere classified: Secondary | ICD-10-CM | POA: Diagnosis not present

## 2019-03-09 DIAGNOSIS — B965 Pseudomonas (aeruginosa) (mallei) (pseudomallei) as the cause of diseases classified elsewhere: Secondary | ICD-10-CM | POA: Diagnosis not present

## 2019-03-09 DIAGNOSIS — L97211 Non-pressure chronic ulcer of right calf limited to breakdown of skin: Secondary | ICD-10-CM | POA: Diagnosis not present

## 2019-03-09 DIAGNOSIS — Z6841 Body Mass Index (BMI) 40.0 and over, adult: Secondary | ICD-10-CM | POA: Diagnosis not present

## 2019-03-09 DIAGNOSIS — L03115 Cellulitis of right lower limb: Secondary | ICD-10-CM | POA: Diagnosis not present

## 2019-03-09 DIAGNOSIS — Z8249 Family history of ischemic heart disease and other diseases of the circulatory system: Secondary | ICD-10-CM | POA: Diagnosis not present

## 2019-03-09 DIAGNOSIS — Z880 Allergy status to penicillin: Secondary | ICD-10-CM | POA: Diagnosis not present

## 2019-03-09 DIAGNOSIS — Z881 Allergy status to other antibiotic agents status: Secondary | ICD-10-CM | POA: Diagnosis not present

## 2019-03-09 DIAGNOSIS — I10 Essential (primary) hypertension: Secondary | ICD-10-CM | POA: Diagnosis not present

## 2019-03-09 DIAGNOSIS — Z8349 Family history of other endocrine, nutritional and metabolic diseases: Secondary | ICD-10-CM | POA: Diagnosis not present

## 2019-03-09 DIAGNOSIS — B9689 Other specified bacterial agents as the cause of diseases classified elsewhere: Secondary | ICD-10-CM | POA: Diagnosis not present

## 2019-03-09 DIAGNOSIS — G4733 Obstructive sleep apnea (adult) (pediatric): Secondary | ICD-10-CM | POA: Diagnosis not present

## 2019-03-09 DIAGNOSIS — I87311 Chronic venous hypertension (idiopathic) with ulcer of right lower extremity: Secondary | ICD-10-CM | POA: Diagnosis not present

## 2019-03-09 DIAGNOSIS — I872 Venous insufficiency (chronic) (peripheral): Secondary | ICD-10-CM | POA: Diagnosis not present

## 2019-03-09 DIAGNOSIS — Z809 Family history of malignant neoplasm, unspecified: Secondary | ICD-10-CM | POA: Diagnosis not present

## 2019-03-10 ENCOUNTER — Other Ambulatory Visit: Payer: Self-pay

## 2019-03-10 ENCOUNTER — Ambulatory Visit (INDEPENDENT_AMBULATORY_CARE_PROVIDER_SITE_OTHER): Payer: BC Managed Care – PPO

## 2019-03-10 ENCOUNTER — Encounter (INDEPENDENT_AMBULATORY_CARE_PROVIDER_SITE_OTHER): Payer: Self-pay | Admitting: Nurse Practitioner

## 2019-03-10 ENCOUNTER — Ambulatory Visit (INDEPENDENT_AMBULATORY_CARE_PROVIDER_SITE_OTHER): Payer: BC Managed Care – PPO | Admitting: Nurse Practitioner

## 2019-03-10 VITALS — BP 164/83 | HR 80 | Resp 16 | Ht 66.0 in | Wt >= 6400 oz

## 2019-03-10 DIAGNOSIS — I890 Lymphedema, not elsewhere classified: Secondary | ICD-10-CM | POA: Diagnosis not present

## 2019-03-10 DIAGNOSIS — M7989 Other specified soft tissue disorders: Secondary | ICD-10-CM

## 2019-03-10 DIAGNOSIS — L97211 Non-pressure chronic ulcer of right calf limited to breakdown of skin: Secondary | ICD-10-CM

## 2019-03-10 DIAGNOSIS — I10 Essential (primary) hypertension: Secondary | ICD-10-CM

## 2019-03-10 DIAGNOSIS — R600 Localized edema: Secondary | ICD-10-CM

## 2019-03-10 DIAGNOSIS — Z79899 Other long term (current) drug therapy: Secondary | ICD-10-CM

## 2019-03-10 NOTE — Progress Notes (Signed)
SUBJECTIVE:  Patient ID: [name] Walker, male    DOB: November 11, 1975, 43 y.o.   MRN: [id number] Chief Complaint  Patient presents with  . Follow-up    ultrasound follow up    HPI  [name] Walker is a 42 y.o. male that presents today for noninvasive studies for lower extremity leg swelling.  The patient recently developed a sudden case of cellulitis to his right lower extremity with subsequent ulceration.  The swelling was sudden and extremely painful.  The wound was weeping at times however now the weeping has subsided.  Currently he is being treated for his wound at the wound center.  He denies any fever, chills, nausea, vomiting or diarrhea.  He denies any chest pain or shortness of breath.  The patient has been utilizing medical grade 1 compression stockings without any relief of swelling.  He also elevates his lower extremities and attempts exercise.  Noninvasive studies show that there is no DVT present bilaterally.  No evidence of chronic venous insufficiency bilaterally.  No evidence of superficial venous thrombosis bilaterally.  However portions of the exam were limited due to the patient's body habitus.  Past Medical History:  Diagnosis Date  . Hypertension   . Sleep apnea     Past Surgical History:  Procedure Laterality Date  . HERNIA REPAIR      Social History   Socioeconomic History  . Marital status: Single    Spouse name: Not on file  . Number of children: Not on file  . Years of education: Not on file  . Highest education level: Not on file  Occupational History  . Not on file  Social Needs  . Financial resource strain: Not on file  . Food insecurity    Worry: Not on file    Inability: Not on file  . Transportation needs    Medical: Not on file    Non-medical: Not on file  Tobacco Use  . Smoking status: Never Smoker  . Smokeless tobacco: Never Used  Substance and Sexual Activity  . Alcohol use: Yes    Alcohol/week: 24.0 standard drinks    Types: 24 Cans of  beer per week  . Drug use: Not Currently  . Sexual activity: Not on file  Lifestyle  . Physical activity    Days per week: Not on file    Minutes per session: Not on file  . Stress: Not on file  Relationships  . Social Herbalist on phone: Not on file    Gets together: Not on file    Attends religious service: Not on file    Active member of club or organization: Not on file    Attends meetings of clubs or organizations: Not on file    Relationship status: Not on file  . Intimate partner violence    Fear of current or ex partner: Not on file    Emotionally abused: Not on file    Physically abused: Not on file    Forced sexual activity: Not on file  Other Topics Concern  . Not on file  Social History Narrative  . Not on file    History reviewed. No pertinent family history.  No Known Allergies   Review of Systems   Review of Systems: Negative Unless Checked Constitutional: [] Weight loss  [] Fever  [] Chills Cardiac: [] Chest pain   []  Atrial Fibrillation  [] Palpitations   [] Shortness of breath when laying flat   [] Shortness of breath with exertion. [] Shortness of breath  at rest Vascular:  [] Pain in legs with walking   [] Pain in legs with standing [] Pain in legs when laying flat   [] Claudication    [] Pain in feet when laying flat    [] History of DVT   [] Phlebitis   [x] Swelling in legs   [] Varicose veins   [x] Non-healing ulcers Pulmonary:   [] Uses home oxygen   [] Productive cough   [] Hemoptysis   [] Wheeze  [] COPD   [] Asthma Neurologic:  [] Dizziness   [] Seizures  [] Blackouts [] History of stroke   [] History of TIA  [] Aphasia   [] Temporary Blindness   [] Weakness or numbness in arm   [] Weakness or numbness in leg Musculoskeletal:   [] Joint swelling   [] Joint pain   [] Low back pain  []  History of Knee Replacement [x] Arthritis [] back Surgeries  []  Spinal Stenosis    Hematologic:  [] Easy bruising  [] Easy bleeding   [] Hypercoagulable state   [] Anemic Gastrointestinal:   [] Diarrhea   [] Vomiting  [] Gastroesophageal reflux/heartburn   [] Difficulty swallowing. [] Abdominal pain Genitourinary:  [] Chronic kidney disease   [] Difficult urination  [] Anuric   [] Blood in urine [] Frequent urination  [] Burning with urination   [] Hematuria Skin:  [x] Rashes   [x] Ulcers [] Wounds Psychological:  [] History of anxiety   []  History of major depression  []  Memory Difficulties      OBJECTIVE:   Physical Exam  BP (!) 164/83 (BP Location: Right Arm)   Pulse 80   Resp 16   Ht 5\' 6"  (1.676 m)   Wt (!) 436 lb (197.8 kg)   BMI 70.37 kg/m   Gen: WD/WN, NAD Head: [place]/AT, No temporalis wasting.  Ear/Nose/Throat: Hearing grossly intact, nares w/o erythema or drainage Eyes: PER, EOMI, sclera nonicteric.  Neck: Supple, no masses.  No JVD.  Pulmonary:  Good air movement, no use of accessory muscles.  Cardiac: RRR Vascular:  Pedal pulses not palpable due to body habitus.  Wound to right lower extremity.  3+ hard edema bilaterally Vessel Right Left  Radial Palpable Palpable   Gastrointestinal: soft, non-distended. No guarding/no peritoneal signs.  Musculoskeletal: M/S 5/5 throughout.  No deformity or atrophy.  Neurologic: Pain and light touch intact in extremities.  Symmetrical.  Speech is fluent. Motor exam as listed above. Psychiatric: Judgment intact, Mood & affect appropriate for pt's clinical situation. Dermatologic:  Stasis dermatitis bilaterally.  No changes consistent with cellulitis. Lymph : No Cervical lymphadenopathy, dermal thickening bilaterally      ASSESSMENT AND PLAN:  1. Lymphedema Recommend:  No surgery or intervention at this point in time.    I have reviewed my previous discussion with the patient regarding swelling and why it causes symptoms.  Patient will continue wearing graduated compression stockings class 1 (20-30 mmHg) on a daily basis. The patient will  beginning wearing the stockings first thing in the morning and removing them in the evening. The  patient is instructed specifically not to sleep in the stockings.    In addition, behavioral modification including several periods of elevation of the lower extremities during the day will be continued.  This was reviewed with the patient during the initial visit.  The patient will also continue routine exercise, especially walking on a daily basis as was discussed during the initial visit.    Despite conservative treatments including graduated compression therapy class 1 and behavioral modification including exercise and elevation the patient  has not obtained adequate control of the lymphedema.  The patient still has stage 3 lymphedema and therefore, I believe that a lymph pump should  be added to improve the control of the patient's lymphedema.  Additionally, a lymph pump is warranted because it will reduce the risk of cellulitis and ulceration in the future.  Patient should follow-up in six months    2. Essential hypertension Continue antihypertensive medications as already ordered, these medications have been reviewed and there are no changes at this time.   3. Lower limb ulcer, calf, right, limited to breakdown of skin [facility]) Patient will continue to have treatment for his wound through the wound center.  Patient is advised to contact us if he develops a wound on the opposite extremity or if swelling worsens.  4. Morbid obesity (HCC) This is a factor for his development of lymphedema as well as the continued swelling.  Patient advised to continue with diet and exercise to assist with wound healing.   Current Outpatient Medications on File Prior to Visit  Medication Sig Dispense Refill  . benazepril-hydrochlorthiazide (LOTENSIN HCT) 20-25 MG per tablet Take 2 tablets by mouth daily.    . ciprofloxacin (CIPRO) 500 MG tablet TAKE 1 TABLET BY MOUTH TWICE DAILY FOR 7 DAYS (TAKE THIS ALONG WITH LINEZOLID)    . furosemide (LASIX) 40 MG tablet TAKE 1 TABLET BY MOUTH ONCE DAILY AS NEEDED FOR  EDEMA    . linezolid (ZYVOX) 600 MG tablet Take 600 mg by mouth 2 (two) times daily. for 7 days    . potassium chloride (K-DUR) 10 MEQ tablet Take by mouth.    . telmisartan (MICARDIS) 40 MG tablet Take 40 mg by mouth daily.    Marland Kitchen. testosterone cypionate (DEPOTESTOTERONE CYPIONATE) 200 MG/ML injection Inject 200 mg into the muscle every 7 (seven) days.     No current facility-administered medications on file prior to visit.     There are no Patient Instructions on file for this visit. Return in about 6 months (around 09/09/2019) for Lymphedema.   Georgiana SpinnerFallon E Orey Moure, NP  This note was completed with Office managerDragon Dictation.  Any errors are purely unintentional.

## 2019-03-12 NOTE — Progress Notes (Signed)
[name] Walker, [name] Walker ([id number]) Visit Report for 03/09/2019 Arrival Information Details Patient Name: [name] Walker, [name] Walker Date of Service: 03/09/2019 1:00 PM Medical Record Number: [id number] Patient Account Number: [id number] Date of Birth/Sex: 1976-03-14 (43 y.o. M) Treating RN: Curtis Sitesorthy, Joanna Primary Care Riccardo Holeman: Daniel NonesKLEIN, BERT Other Clinician: Referring Kerwin Augustus: Daniel NonesKLEIN, BERT Treating Santoria Chason/Extender: Altamese CarolinaOBSON, MICHAEL G Weeks in Treatment: 1 Visit Information History Since Last Visit Added or deleted any medications: No Patient Arrived: Ambulatory Any new allergies or adverse reactions: No Arrival Time: 13:01 Had a fall or experienced change in No Accompanied By: self activities of daily living that may affect Transfer Assistance: None risk of falls: Patient Identification Verified: Yes Signs or symptoms of abuse/neglect since last visito No Secondary Verification Process Yes Hospitalized since last visit: No Completed: Implantable device outside of the clinic excluding No Patient Has Alerts: Yes cellular tissue based products placed in the center Patient Alerts: Non- since last visit: compressible Has Dressing in Place as Prescribed: Yes Has Compression in Place as Prescribed: No Pain Present Now: No Electronic Signature(s) Signed: 03/10/2019 5:10:09 PM By: Curtis Sitesorthy, Joanna Entered By: Curtis Sitesorthy, Joanna on 03/09/2019 13:01:21 [name] Walker, [name] Walker ([id number]) -------------------------------------------------------------------------------- Clinic Level of Care Assessment Details Patient Name: [name] Walker, [name] Walker Date of Service: 03/09/2019 1:00 PM Medical Record Number: [id number] Patient Account Number: [id number] Date of Birth/Sex: 1976-03-14 (43 y.o. M) Treating RN: Huel CoventryWoody, Kim Primary Care Johm Pfannenstiel: Daniel NonesKLEIN, BERT Other Clinician: Referring Tobie Perdue: Daniel NonesKLEIN, BERT Treating Jameek Bruntz/Extender: Altamese CarolinaOBSON, MICHAEL G Weeks in Treatment: 1 Clinic Level of Care Assessment Items TOOL 4 Quantity Score []  - Use when  only an EandM is performed on FOLLOW-UP visit 0 ASSESSMENTS - Nursing Assessment / Reassessment X - Reassessment of Co-morbidities (includes updates in patient status) 1 10 X- 1 5 Reassessment of Adherence to Treatment Plan ASSESSMENTS - Wound and Skin Assessment / Reassessment X - Simple Wound Assessment / Reassessment - one wound 1 5 []  - 0 Complex Wound Assessment / Reassessment - multiple wounds []  - 0 Dermatologic / Skin Assessment (not related to wound area) ASSESSMENTS - Focused Assessment []  - Circumferential Edema Measurements - multi extremities 0 []  - 0 Nutritional Assessment / Counseling / Intervention []  - 0 Lower Extremity Assessment (monofilament, tuning fork, pulses) []  - 0 Peripheral Arterial Disease Assessment (using hand held doppler) ASSESSMENTS - Ostomy and/or Continence Assessment and Care []  - Incontinence Assessment and Management 0 []  - 0 Ostomy Care Assessment and Management (repouching, etc.) PROCESS - Coordination of Care X - Simple Patient / Family Education for ongoing care 1 15 []  - 0 Complex (extensive) Patient / Family Education for ongoing care X- 1 10 Staff obtains ChiropractorConsents, Records, Test Results / Process Orders []  - 0 Staff telephones HHA, Nursing Homes / Clarify orders / etc []  - 0 Routine Transfer to another Facility (non-emergent condition) []  - 0 Routine Hospital Admission (non-emergent condition) []  - 0 New Admissions / Manufacturing engineernsurance Authorizations / Ordering NPWT, Apligraf, etc. []  - 0 Emergency Hospital Admission (emergent condition) X- 1 10 Simple Discharge Coordination [name] Walker, [name] Walker ([id number]) []  - 0 Complex (extensive) Discharge Coordination PROCESS - Special Needs []  - Pediatric / Minor Patient Management 0 []  - 0 Isolation Patient Management []  - 0 Hearing / Language / Visual special needs []  - 0 Assessment of Community assistance (transportation, D/C planning, etc.) []  - 0 Additional assistance / Altered mentation []   - 0 Support Surface(s) Assessment (bed, cushion, seat, etc.) INTERVENTIONS - Wound Cleansing / Measurement X - Simple Wound Cleansing - one wound 1 5 []  - 0 Complex  Wound Cleansing - multiple wounds X- 1 5 Wound Imaging (photographs - any number of wounds) []  - 0 Wound Tracing (instead of photographs) X- 1 5 Simple Wound Measurement - one wound []  - 0 Complex Wound Measurement - multiple wounds INTERVENTIONS - Wound Dressings []  - Small Wound Dressing one or multiple wounds 0 []  - 0 Medium Wound Dressing one or multiple wounds X- 1 20 Large Wound Dressing one or multiple wounds []  - 0 Application of Medications - topical []  - 0 Application of Medications - injection INTERVENTIONS - Miscellaneous []  - External ear exam 0 []  - 0 Specimen Collection (cultures, biopsies, blood, body fluids, etc.) []  - 0 Specimen(s) / Culture(s) sent or taken to Lab for analysis []  - 0 Patient Transfer (multiple staff / Civil Service fast streamer / Similar devices) []  - 0 Simple Staple / Suture removal (25 or less) []  - 0 Complex Staple / Suture removal (26 or more) []  - 0 Hypo / Hyperglycemic Management (close monitor of Blood Glucose) []  - 0 Ankle / Brachial Index (ABI) - do not check if billed separately X- 1 5 Vital Signs Walker, [name] ([id number]) Has the patient been seen at the hospital within the last three years: Yes Total Score: 95 Level Of Care: New/Established - Level 3 Electronic Signature(s) Signed: 03/11/2019 4:38:11 PM By: Gretta Cool, BSN, RN, CWS, Kim RN, BSN Entered By: Gretta Cool, BSN, RN, CWS, Kim on 03/09/2019 13:28:28 Walker, [name] ([id number]) -------------------------------------------------------------------------------- Encounter Discharge Information Details Patient Name: [name] Walker, [name] Walker Date of Service: 03/09/2019 1:00 PM Medical Record Number: [id number] Patient Account Number: [id number] Date of Birth/Sex: 17-Nov-1975 (42 y.o. M) Treating RN: Army Melia Primary Care Warwick Nick: Ramonita Lab  Other Clinician: Referring Zyheir Daft: Ramonita Lab Treating Alexzander Dolinger/Extender: Tito Dine in Treatment: 1 Encounter Discharge Information Items Discharge Condition: Stable Ambulatory Status: Ambulatory Discharge Destination: Home Transportation: Private Auto Accompanied By: self Schedule Follow-up Appointment: Yes Clinical Summary of Care: Electronic Signature(s) Signed: 03/09/2019 3:49:41 PM By: Army Melia Entered By: Army Melia on 03/09/2019 13:38:09 Signor, Kristine ([id number]) -------------------------------------------------------------------------------- Lower Extremity Assessment Details Patient Name: [name] Walker, Cylis Date of Service: 03/09/2019 1:00 PM Medical Record Number: [id number] Patient Account Number: [id number] Date of Birth/Sex: [birth year] (43 y.o. M) Treating RN: Montey Hora Primary Care Minal Stuller: Ramonita Lab Other Clinician: Referring Santana Edell: Ramonita Lab Treating Eddi Hymes/Extender: Ricard Dillon Weeks in Treatment: 1 Edema Assessment Assessed: [Left: No] [Right: No] Edema: [Left: Ye] [Right: s] Calf Left: Right: Point of Measurement: 33 cm From Medial Instep cm 52 cm Ankle Left: Right: Point of Measurement: 10 cm From Medial Instep cm 32 cm Vascular Assessment Pulses: Dorsalis Pedis Palpable: [Right:Yes] Electronic Signature(s) Signed: 03/10/2019 5:10:09 PM By: Montey Hora Entered By: Montey Hora on 03/09/2019 13:08:25 Groner, Damauri ([id number]) -------------------------------------------------------------------------------- Multi Wound Chart Details Patient Name: [name] Walker, Davontae Date of Service: 03/09/2019 1:00 PM Medical Record Number: [id number] Patient Account Number: [id number] Date of Birth/Sex: Apr 19, 1976 (43 y.o. M) Treating RN: Cornell Barman Primary Care Shanyia Stines: Ramonita Lab Other Clinician: Referring Kimmora Risenhoover: Ramonita Lab Treating Cal Gindlesperger/Extender: Ricard Dillon Weeks in Treatment: 1 Vital Signs Height(in):  66 Pulse(bpm): 94 Weight(lbs): 442 Blood Pressure(mmHg): 118/90 Body Mass Index(BMI): 71 Temperature(F): 98.6 Respiratory Rate 18 (breaths/min): Photos: [N/A:N/A] Wound Location: Right Lower Leg - Posterior N/A N/A Wounding Event: Gradually Appeared N/A N/A Primary Etiology: Lymphedema N/A N/A Secondary Etiology: Venous Leg Ulcer N/A N/A Comorbid History: Hypertension N/A N/A Date Acquired: 02/24/2019 N/A N/A Weeks of Treatment: 1 N/A N/A Wound Status: Open N/A N/A Measurements L x W x D 18x26x0.1  N/A N/A (cm) Area (cm) : 367.566 N/A N/A Volume (cm) : 36.757 N/A N/A % Reduction in Area: -2.60% N/A N/A % Reduction in Volume: -2.60% N/A N/A Classification: Full Thickness Without N/A N/A Exposed Support Structures Exudate Amount: Large N/A N/A Exudate Type: Serous N/A N/A Exudate Color: amber N/A N/A Wound Margin: Flat and Intact N/A N/A Aguino, Azekiel ([id number]) Granulation Amount: Medium (34-66%) N/A N/A Granulation Quality: Pale N/A N/A Necrotic Amount: Medium (34-66%) N/A N/A Exposed Structures: Fat Layer (Subcutaneous N/A N/A Tissue) Exposed: Yes Fascia: No Tendon: No Muscle: No Joint: No Bone: No Epithelialization: None N/A N/A Treatment Notes Electronic Signature(s) Signed: 03/09/2019 4:13:17 PM By: Baltazar Najjarobson, Michael MD Entered By: Baltazar Najjarobson, Michael on 03/09/2019 13:30:42 Lombard, Nahzir ([id number]) -------------------------------------------------------------------------------- Multi-Disciplinary Care Plan Details Patient Name: [name] Walker, [name] Walker Date of Service: 03/09/2019 1:00 PM Medical Record Number: [id number] Patient Account Number: [id number] Date of Birth/Sex: [birth year] (43 y.o. M) Treating RN: Huel CoventryWoody, Kim Primary Care Crislyn Willbanks: Daniel NonesKLEIN, BERT Other Clinician: Referring Sloane Junkin: Daniel NonesKLEIN, BERT Treating Josey Forcier/Extender: Altamese CarolinaOBSON, MICHAEL G Weeks in Treatment: 1 Active Inactive Orientation to the Wound Care Program Nursing Diagnoses: Knowledge deficit related to  the wound healing center program Goals: Patient/caregiver will verbalize understanding of the Wound Healing Center Program Date Initiated: 03/02/2019 Target Resolution Date: 03/02/2019 Goal Status: Active Interventions: Provide education on orientation to the wound center Notes: Soft Tissue Infection Nursing Diagnoses: Impaired tissue integrity Potential for infection: soft tissue Goals: Patient/caregiver will verbalize understanding of or measures to prevent infection and contamination in the home setting Date Initiated: 03/02/2019 Target Resolution Date: 03/02/2019 Goal Status: Active Patient's soft tissue infection will resolve Date Initiated: 03/02/2019 Target Resolution Date: 03/09/2019 Goal Status: Active Signs and symptoms of infection will be recognized early to allow for prompt treatment Date Initiated: 03/02/2019 Target Resolution Date: 03/09/2019 Goal Status: Active Interventions: Assess signs and symptoms of infection every visit Treatment Activities: Systemic antibiotics : 03/02/2019 Notes: Wound/Skin Impairment Durnell, Tammy ([id number]) Nursing Diagnoses: Impaired tissue integrity Knowledge deficit related to ulceration/compromised skin integrity Goals: Patient/caregiver will verbalize understanding of skin care regimen Date Initiated: 03/02/2019 Target Resolution Date: 03/02/2019 Goal Status: Active Ulcer/skin breakdown will have a volume reduction of 30% by week 4 Date Initiated: 03/02/2019 Target Resolution Date: 03/30/2019 Goal Status: Active Interventions: Assess ulceration(s) every visit Provide education on ulcer and skin care Treatment Activities: Skin care regimen initiated : 03/02/2019 Topical wound management initiated : 03/02/2019 Notes: Electronic Signature(s) Signed: 03/11/2019 4:38:11 PM By: Elliot GurneyWoody, BSN, RN, CWS, Kim RN, BSN Entered By: Elliot GurneyWoody, BSN, RN, CWS, Kim on 03/09/2019 13:20:10 Mcpherson, Johnathon  ([id number]) -------------------------------------------------------------------------------- Pain Assessment Details Patient Name: [name] Walker, [name] Walker Date of Service: 03/09/2019 1:00 PM Medical Record Number: [id number] Patient Account Number: [id number] Date of Birth/Sex: [birth year] (43 y.o. M) Treating RN: Curtis Sitesorthy, Joanna Primary Care Laurelin Elson: Daniel NonesKLEIN, BERT Other Clinician: Referring Orin Eberwein: Daniel NonesKLEIN, BERT Treating Jaida Basurto/Extender: Maxwell CaulOBSON, MICHAEL G Weeks in Treatment: 1 Active Problems Location of Pain Severity and Description of Pain Patient Has Paino No Site Locations Pain Management and Medication Current Pain Management: Electronic Signature(s) Signed: 03/10/2019 5:10:09 PM By: Curtis Sitesorthy, Joanna Entered By: Curtis Sitesorthy, Joanna on 03/09/2019 13:01:34 Juarbe, Nicholaos ([id number]) -------------------------------------------------------------------------------- Patient/Caregiver Education Details Patient Name: [name] Walker, [name] Walker Date of Service: 03/09/2019 1:00 PM Medical Record Number: [id number] Patient Account Number: [id number] Date of Birth/Gender: [birth year] (43 y.o. M) Treating RN: Huel CoventryWoody, Kim Primary Care Physician: Daniel NonesKLEIN, BERT Other Clinician: Referring Physician: Daniel NonesKLEIN, BERT Treating Physician/Extender: Altamese CarolinaOBSON, MICHAEL G Weeks in Treatment: 1 Education Assessment Education Provided To: Patient Education Topics Provided Venous: Handouts: Controlling  Swelling with Multilayered Compression Wraps Methods: Demonstration, Explain/Verbal Responses: State content correctly Electronic Signature(s) Signed: 03/11/2019 4:38:11 PM By: Elliot GurneyWoody, BSN, RN, CWS, Kim RN, BSN Entered By: Elliot GurneyWoody, BSN, RN, CWS, Kim on 03/09/2019 13:29:11 Kneale, Kurk ([id number]) -------------------------------------------------------------------------------- Wound Assessment Details Patient Name: [name] Walker, [name] Walker Date of Service: 03/09/2019 1:00 PM Medical Record Number: [id number] Patient Account Number: [id number] Date of Birth/Sex:  1976-04-29 ([age] y.o. M) Treating RN: Curtis Sitesorthy, Joanna Primary Care Emoree Sasaki: Daniel NonesKLEIN, BERT Other Clinician: Referring Zollie Ellery: Daniel NonesKLEIN, BERT Treating Vaun Hyndman/Extender: Maxwell CaulOBSON, MICHAEL G Weeks in Treatment: 1 Wound Status Wound Number: 1 Primary Etiology: Lymphedema Wound Location: Right Lower Leg - Posterior Secondary Etiology: Venous Leg Ulcer Wounding Event: Gradually Appeared Wound Status: Open Date Acquired: 02/24/2019 Comorbid History: Hypertension Weeks Of Treatment: 1 Clustered Wound: No Photos Wound Measurements Length: (cm) 18 Width: (cm) 26 Depth: (cm) 0.1 Area: (cm) 367.566 Volume: (cm) 36.757 % Reduction in Area: -2.6% % Reduction in Volume: -2.6% Epithelialization: None Tunneling: No Undermining: No Wound Description Full Thickness Without Exposed Support Classification: Structures Wound Margin: Flat and Intact Exudate Large Amount: Exudate Type: Serous Exudate Color: amber Foul Odor After Cleansing: No Slough/Fibrino Yes Wound Bed Granulation Amount: Medium (34-66%) Exposed Structure Granulation Quality: Pale Fascia Exposed: No Necrotic Amount: Medium (34-66%) Fat Layer (Subcutaneous Tissue) Exposed: Yes Necrotic Quality: Adherent Slough Tendon Exposed: No Muscle Exposed: No Joint Exposed: No Bone Exposed: No Tremaine, Ciaran ([id number]) Treatment Notes Wound #1 (Right, Posterior Lower Leg) Notes silvercel, abd, kerlix, coban. Electronic Signature(s) Signed: 03/10/2019 5:10:09 PM By: Curtis Sitesorthy, Joanna Entered By: Curtis Sitesorthy, Joanna on 03/09/2019 13:11:50 Louk, Waseem ([id number]) -------------------------------------------------------------------------------- Vitals Details Patient Name: [name] Walker, [name] Walker Date of Service: 03/09/2019 1:00 PM Medical Record Number: [id number] Patient Account Number: [id number] Date of Birth/Sex: 1976-04-29 (43 y.o. M) Treating RN: Curtis Sitesorthy, Joanna Primary Care Syreeta Figler: Daniel NonesKLEIN, BERT Other Clinician: Referring Ivoree Felmlee: Daniel NonesKLEIN,  BERT Treating Dalissa Lovin/Extender: Altamese CarolinaOBSON, MICHAEL G Weeks in Treatment: 1 Vital Signs Time Taken: 13:06 Temperature (F): 98.6 Height (in): 66 Pulse (bpm): 94 Weight (lbs): 442 Respiratory Rate (breaths/min): 18 Body Mass Index (BMI): 71.3 Blood Pressure (mmHg): 118/90 Reference Range: 80 - 120 mg / dl Electronic Signature(s) Signed: 03/10/2019 5:10:09 PM By: Curtis Sitesorthy, Joanna Entered By: Curtis Sitesorthy, Joanna on 03/09/2019 13:06:38

## 2019-03-12 NOTE — Progress Notes (Signed)
[name], Delvonte ([id number]) Visit Report for 03/09/2019 HPI Details Patient Name: [name], Manus Date of Service: 03/09/2019 1:00 PM Medical Record Number: [id number] Patient Account Number: [id number] Date of Birth/Sex: September 03, 1976 (43 y.o. M) Treating RN: Huel CoventryWoody, Kim Primary Care Provider: Daniel NonesKLEIN, BERT Other Clinician: Referring Provider: Daniel NonesKLEIN, BERT Treating Provider/Extender: Altamese CarolinaOBSON, Jassica Zazueta G Weeks in Treatment: 1 History of Present Illness HPI Description: ADMISSION 03/02/2019 This is a 43 year old man who is not a diabetic. He states he was well up until 8 days ago when he felt fatigued and generally unwell while at work. He went home and discovered intense erythema in the right leg. This was painful. He did not make it into see his doctor until 3 days later. He was put on doxycycline. He had a duplex ultrasound done at [facility]al radiology that did not show reflux, DVT or superficial thrombophlebitis. He was put on doxycycline which she is still taking. He was put in an Radio broadcast assistantUnna boot. He was reviewed again 2 days ago and referred here. He had an Radio broadcast assistantUnna boot on when he came in here today. Patient states he is not systemically unwell but the leg is uncomfortable. He does not have a history of wounds on his legs. He does have some discoloration of the left leg which looks like chronic venous changes however he states that the right leg did not have this prior to any changes recently. I note that he has been seen by Dr. Graciela HusbandsKlein at the [facility] worked up for lower extremity edema. He had an echocardiogram. Past medical history includes hypertension, edema of both legs, obstructive sleep apnea, morbid obesity. Allergies, he is listed as being allergic to amoxicillin from 2015 and his primary doctor's office although I do not see this in epic. The patient does not remember anything about this. He does not remember being on amoxicillin. Socially; he works in a Education officer, communitysewage and water treatment. But he  denies getting his leg into contact with anything particularly problematic ABIs in our clinic were noncompressible bilaterally 03/04/19 upon evaluation today patient has come in for reevaluation due to the fact that he had a significant right lower extremity cellulitis when he was seen in the office on Wednesday, two days ago. Subsequently he did not want to go to the ER for likely admission and I'll be anabiotic therapy. For that reason we actually have placed him when he saw Dr. Leanord Hawkingobson Wednesday on linezolid which he has been taking since that time. Subsequently the culture come back showing gram- negative rods and therefore Cipro was also recommended by Dr. Leanord Hawkingobson although that has not been seen in yet that was just this morning. The final culture and sensitivity has not returned yet. No fevers, chills, nausea, or vomiting noted at this time. As far as the markings around the leg actually appears that their theme a has receded somewhat compared to what it showed during the evaluation today is ago and he also states that is not as tender to touch which is also good news. 6/24; the culture I did of what I thought was a blister in the lower part of this wound grew gram-negative's. This was Pseudomonas and Serratia. In response to that we put him on ciprofloxacin which will end in 2 days. He also had Lunesta lid which fortunately did not cost him too much. His leg is a lot better. There is no tenderness. He has some erythema continuing but this is not tender. He was referred to Dr. dew at [facility]  by his primary doctor. They felt that he had lymphedema and that lymphedema pumps would benefit. They are ordering a reflux study that should be done tomorrow. He has remained systemically well. Overall his cellulitis was extensive things look a lot better today than a week ago Electronic Signature(s) Signed: 03/09/2019 4:13:17 PM By: Baltazar Najjarobson, Kwanza Cancelliere MD Entered By: Baltazar Najjarobson, Akul Leggette on 03/09/2019  13:33:58 Erisman, Teren ([id number]) Shaw, Kaedon ([id number]) -------------------------------------------------------------------------------- Physical Exam Details Patient Name: [name], Prinston Date of Service: 03/09/2019 1:00 PM Medical Record Number: [id number] Patient Account Number: [id number] Date of Birth/Sex: 1976-02-26 (43 y.o. M) Treating RN: Huel CoventryWoody, Kim Primary Care Provider: Daniel NonesKLEIN, BERT Other Clinician: Referring Provider: Daniel NonesKLEIN, BERT Treating Provider/Extender: Maxwell CaulOBSON, Sanel Stemmer G Weeks in Treatment: 1 Constitutional Patient is hypertensive.. Pulse regular and within target range for patient.Marland Kitchen. Respirations regular, non-labored and within target range.. Temperature is normal and within the target range for the patient.Marland Kitchen. appears in no distress. Respiratory Respiratory effort is easy and symmetric bilaterally. Rate is normal at rest and on room air.. Cardiovascular Pedal pulses palpable at the dorsalis pedis and posterior tibial.. Nonpitting edema in the right leg and foot. Lymphatic None palpable in the popliteal area. Integumentary (Hair, Skin) There is still erythema in the right leg but this is nontender. Not nearly as warm and tender as it was a week ago. Notes Wound exam; patient's lower extremity looks better than when I saw him a week ago. There is no tenderness but still some erythema. Substantial wound on the posterior calf has cleaned up quite nicely. Today vigorously debrided with gauze and saline. A lot of slough adherent to the wound removed. There is still a fair amount of edema which is nonpitting in the leg and the dorsal aspect of the foot Electronic Signature(s) Signed: 03/09/2019 4:13:17 PM By: Baltazar Najjarobson, Mikel Pyon MD Entered By: Baltazar Najjarobson, Isidra Mings on 03/09/2019 13:36:42 Wiehe, Kobe ([id number]) -------------------------------------------------------------------------------- Physician Orders Details Patient Name: [name], Colbin Date of Service: 03/09/2019 1:00 PM Medical  Record Number: [id number] Patient Account Number: [id number] Date of Birth/Sex: 1976-02-26 (43 y.o. M) Treating RN: Huel CoventryWoody, Kim Primary Care Provider: Daniel NonesKLEIN, BERT Other Clinician: Referring Provider: Daniel NonesKLEIN, BERT Treating Provider/Extender: Altamese CarolinaOBSON, Marleigh Kaylor G Weeks in Treatment: 1 Verbal / Phone Orders: No Diagnosis Coding Wound Cleansing Wound #1 Right,Posterior Lower Leg o Dial antibacterial soap, wash wounds, rinse and pat dry prior to dressing wounds o May shower with protection. Anesthetic (add to Medication List) Wound #1 Right,Posterior Lower Leg o Topical Lidocaine 4% cream applied to wound bed prior to debridement (In Clinic Only). Primary Wound Dressing Wound #1 Right,Posterior Lower Leg o Silver Alginate Secondary Dressing Wound #1 Right,Posterior Lower Leg o ABD pad Dressing Change Frequency Wound #1 Right,Posterior Lower Leg o Change Dressing Monday, Wednesday, Friday Follow-up Appointments Wound #1 Right,Posterior Lower Leg o Return Appointment in 1 week. Edema Control Wound #1 Right,Posterior Lower Leg o Kerlix and Coban - Right Lower Extremity o 3 Layer Compression System - Right Lower Extremity - beginning Friday 6/26 after [facility] appointment. o Elevate legs to the level of the heart and pump ankles as often as possible Additional Orders / Instructions o Other: - Out of work until the July 3rd. Will re-evaluate at that time. Medications-please add to medication list. Wound #1 Right,Posterior Lower Leg o P.O. Antibiotics - Continue antibiotics until complete Electronic Signature(s) Signed: 03/09/2019 4:13:17 PM By: Baltazar Najjarobson, Hriday Stai MD [place] TawasSHAFFER, Viral ([id number]) Signed: 03/11/2019 4:38:11 PM By: Elliot GurneyWoody, BSN, RN, CWS, Kim RN, BSN Entered By: Elliot GurneyWoody, BSN, RN, CWS, Kim on 03/09/2019 13:29:45 Radman,  Rasmus ([id number]) -------------------------------------------------------------------------------- Problem List Details Patient Name: [name], Harbert Date  of Service: 03/09/2019 1:00 PM Medical Record Number: [id number] Patient Account Number: [id number] Date of Birth/Sex: Mar 09, 1976 (43 y.o. M) Treating RN: Huel CoventryWoody, Kim Primary Care Provider: Daniel NonesKLEIN, BERT Other Clinician: Referring Provider: Daniel NonesKLEIN, BERT Treating Provider/Extender: Altamese CarolinaOBSON, Darya Bigler G Weeks in Treatment: 1 Active Problems ICD-10 Evaluated Encounter Code Description Active Date Today Diagnosis L97.211 Non-pressure chronic ulcer of right calf limited to breakdown 03/02/2019 No Yes of skin L03.115 Cellulitis of right lower limb 03/02/2019 No Yes I87.311 Chronic venous hypertension (idiopathic) with ulcer of right 03/02/2019 No Yes lower extremity I89.0 Lymphedema, not elsewhere classified 03/09/2019 No Yes Inactive Problems Resolved Problems Electronic Signature(s) Signed: 03/09/2019 4:13:17 PM By: Baltazar Najjarobson, Chyane Greer MD Entered By: Baltazar Najjarobson, Coal Nearhood on 03/09/2019 13:30:33 Beswick, Baldwin ([id number]) -------------------------------------------------------------------------------- Progress Note Details Patient Name: [name], Chapin Date of Service: 03/09/2019 1:00 PM Medical Record Number: [id number] Patient Account Number: [id number] Date of Birth/Sex: Mar 09, 1976 (43 y.o. M) Treating RN: Huel CoventryWoody, Kim Primary Care Provider: Daniel NonesKLEIN, BERT Other Clinician: Referring Provider: Daniel NonesKLEIN, BERT Treating Provider/Extender: Maxwell CaulOBSON, Neda Willenbring G Weeks in Treatment: 1 Subjective History of Present Illness (HPI) ADMISSION 03/02/2019 This is a 43 year old man who is not a diabetic. He states he was well up until 8 days ago when he felt fatigued and generally unwell while at work. He went home and discovered intense erythema in the right leg. This was painful. He did not make it into see his doctor until 3 days later. He was put on doxycycline. He had a duplex ultrasound done at interventional radiology that did not show reflux, DVT or superficial thrombophlebitis. He was put on doxycycline which she is still  taking. He was put in an Radio broadcast assistantUnna boot. He was reviewed again 2 days ago and referred here. He had an Radio broadcast assistantUnna boot on when he came in here today. Patient states he is not systemically unwell but the leg is uncomfortable. He does not have a history of wounds on his legs. He does have some discoloration of the left leg which looks like chronic venous changes however he states that the right leg did not have this prior to any changes recently. I note that he has been seen by Dr. Graciela HusbandsKlein at the [facility] worked up for lower extremity edema. He had an echocardiogram. Past medical history includes hypertension, edema of both legs, obstructive sleep apnea, morbid obesity. Allergies, he is listed as being allergic to amoxicillin from 2015 and his primary doctor's office although I do not see this in epic. The patient does not remember anything about this. He does not remember being on amoxicillin. Socially; he works in a Education officer, communitysewage and water treatment. But he denies getting his leg into contact with anything particularly problematic ABIs in our clinic were noncompressible bilaterally 03/04/19 upon evaluation today patient has come in for reevaluation due to the fact that he had a significant right lower extremity cellulitis when he was seen in the office on Wednesday, two days ago. Subsequently he did not want to go to the ER for likely admission and I'll be anabiotic therapy. For that reason we actually have placed him when he saw Dr. Leanord Hawkingobson Wednesday on linezolid which he has been taking since that time. Subsequently the culture come back showing gram- negative rods and therefore Cipro was also recommended by Dr. Leanord Hawkingobson although that has not been seen in yet that was just this morning. The final culture and sensitivity has not returned yet. No fevers, chills, nausea, or vomiting  noted at this time. As far as the markings around the leg actually appears that their theme a has receded somewhat compared to what it  showed during the evaluation today is ago and he also states that is not as tender to touch which is also good news. 6/24; the culture I did of what I thought was a blister in the lower part of this wound grew gram-negative's. This was Pseudomonas and Serratia. In response to that we put him on ciprofloxacin which will end in 2 days. He also had Lunesta lid which fortunately did not cost him too much. His leg is a lot better. There is no tenderness. He has some erythema continuing but this is not tender. He was referred to Dr. dew at [facility] by his primary doctor. They felt that he had lymphedema and that lymphedema pumps would benefit. They are ordering a reflux study that should be done tomorrow. He has remained systemically well. Overall his cellulitis was extensive things look a lot better today than a week ago Objective Hada, Jahmire ([id number]) Constitutional Patient is hypertensive.. Pulse regular and within target range for patient.Marland Kitchen Respirations regular, non-labored and within target range.. Temperature is normal and within the target range for the patient.Marland Kitchen appears in no distress. Vitals Time Taken: 1:06 PM, Height: 66 in, Weight: 442 lbs, BMI: 71.3, Temperature: 98.6 F, Pulse: 94 bpm, Respiratory Rate: 18 breaths/min, Blood Pressure: 118/90 mmHg. Respiratory Respiratory effort is easy and symmetric bilaterally. Rate is normal at rest and on room air.. Cardiovascular Pedal pulses palpable at the dorsalis pedis and posterior tibial.. Nonpitting edema in the right leg and foot. Lymphatic None palpable in the popliteal area. General Notes: Wound exam; patient's lower extremity looks better than when I saw him a week ago. There is no tenderness but still some erythema. Substantial wound on the posterior calf has cleaned up quite nicely. Today vigorously debrided with gauze and saline. A lot of slough adherent to the wound removed. There is still a fair amount of edema which is  nonpitting in the leg and the dorsal aspect of the foot Integumentary (Hair, Skin) There is still erythema in the right leg but this is nontender. Not nearly as warm and tender as it was a week ago. Wound #1 status is Open. Original cause of wound was Gradually Appeared. The wound is located on the Right,Posterior Lower Leg. The wound measures 18cm length x 26cm width x 0.1cm depth; 367.566cm^2 area and 36.757cm^3 volume. There is Fat Layer (Subcutaneous Tissue) Exposed exposed. There is no tunneling or undermining noted. There is a large amount of serous drainage noted. The wound margin is flat and intact. There is medium (34-66%) pale granulation within the wound bed. There is a medium (34-66%) amount of necrotic tissue within the wound bed including Adherent Slough. Assessment Active Problems ICD-10 Non-pressure chronic ulcer of right calf limited to breakdown of skin Cellulitis of right lower limb Chronic venous hypertension (idiopathic) with ulcer of right lower extremity Lymphedema, not elsewhere classified Plan Wound Cleansing: Wound #1 Right,Posterior Lower Leg: Dial antibacterial soap, wash wounds, rinse and pat dry prior to dressing wounds May shower with protection. Anesthetic (add to Medication List): Pomeroy, Royalty ([id number]) Wound #1 Right,Posterior Lower Leg: Topical Lidocaine 4% cream applied to wound bed prior to debridement (In Clinic Only). Primary Wound Dressing: Wound #1 Right,Posterior Lower Leg: Silver Alginate Secondary Dressing: Wound #1 Right,Posterior Lower Leg: ABD pad Dressing Change Frequency: Wound #1 Right,Posterior Lower Leg: Change Dressing Monday, Wednesday,  Friday Follow-up Appointments: Wound #1 Right,Posterior Lower Leg: Return Appointment in 1 week. Edema Control: Wound #1 Right,Posterior Lower Leg: Kerlix and Coban - Right Lower Extremity 3 Layer Compression System - Right Lower Extremity - beginning Friday 6/26 after [facility]  appointment. Elevate legs to the level of the heart and pump ankles as often as possible Additional Orders / Instructions: Other: - Out of work until the July 3rd. Will re-evaluate at that time. Medications-please add to medication list.: Wound #1 Right,Posterior Lower Leg: P.O. Antibiotics - Continue antibiotics until complete 1. He will finish his ciprofloxacin on Friday I think that is enough antibiotics. The cellulitis component of this is considerably better the resultant skin destruction on the back of his leg is stable. Wound surface looks better 2. We put silver alginate/ABDs kerlix and Coban on him today however I believe he is having reflux studies tomorrow at vein and vascular we will bring him back for a more aggressive compression starting on Friday. 3. No doubt going forward he is going to need compression stockings probably 30/40 Electronic Signature(s) Signed: 03/09/2019 4:13:17 PM By: Linton Ham MD Entered By: Linton Ham on 03/09/2019 13:38:12 Markwood, Nilan ([id number]) -------------------------------------------------------------------------------- SuperBill Details Patient Name: [name], Mcgregor Date of Service: 03/09/2019 Medical Record Number: [id number] Patient Account Number: [id number] Date of Birth/Sex: 10-23-1975 (43 y.o. M) Treating RN: Cornell Barman Primary Care Provider: Ramonita Lab Other Clinician: Referring Provider: Ramonita Lab Treating Provider/Extender: Ricard Dillon Weeks in Treatment: 1 Diagnosis Coding ICD-10 Codes Code Description [phone] Non-pressure chronic ulcer of right calf limited to breakdown of skin L03.115 Cellulitis of right lower limb I87.311 Chronic venous hypertension (idiopathic) with ulcer of right lower extremity Facility Procedures CPT4 Code: [id number] Description: 99213 - WOUND CARE VISIT-LEV 3 EST PT Modifier: Quantity: 1 Physician Procedures CPT4 Code: 7989211 Description: 94174 - WC PHYS LEVEL 3 - EST PT ICD-10  Diagnosis Description L97.211 Non-pressure chronic ulcer of right calf limited to breakdown L03.115 Cellulitis of right lower limb I87.311 Chronic venous hypertension (idiopathic) with ulcer of right Modifier: of skin lower extremity Quantity: 1 Electronic Signature(s) Signed: 03/09/2019 4:13:17 PM By: Linton Ham MD Entered By: Linton Ham on 03/09/2019 13:38:49

## 2019-03-16 ENCOUNTER — Encounter: Payer: BC Managed Care – PPO | Attending: Internal Medicine | Admitting: Internal Medicine

## 2019-03-16 ENCOUNTER — Other Ambulatory Visit: Payer: Self-pay

## 2019-03-16 DIAGNOSIS — L97211 Non-pressure chronic ulcer of right calf limited to breakdown of skin: Secondary | ICD-10-CM | POA: Diagnosis not present

## 2019-03-16 DIAGNOSIS — I890 Lymphedema, not elsewhere classified: Secondary | ICD-10-CM | POA: Diagnosis not present

## 2019-03-16 DIAGNOSIS — I87311 Chronic venous hypertension (idiopathic) with ulcer of right lower extremity: Secondary | ICD-10-CM | POA: Insufficient documentation

## 2019-03-16 DIAGNOSIS — L03115 Cellulitis of right lower limb: Secondary | ICD-10-CM | POA: Insufficient documentation

## 2019-03-16 DIAGNOSIS — S81801A Unspecified open wound, right lower leg, initial encounter: Secondary | ICD-10-CM | POA: Diagnosis not present

## 2019-03-21 ENCOUNTER — Other Ambulatory Visit: Payer: Self-pay

## 2019-03-21 DIAGNOSIS — L03115 Cellulitis of right lower limb: Secondary | ICD-10-CM | POA: Diagnosis not present

## 2019-03-21 DIAGNOSIS — I890 Lymphedema, not elsewhere classified: Secondary | ICD-10-CM | POA: Diagnosis not present

## 2019-03-21 DIAGNOSIS — L97211 Non-pressure chronic ulcer of right calf limited to breakdown of skin: Secondary | ICD-10-CM | POA: Diagnosis not present

## 2019-03-21 DIAGNOSIS — I87311 Chronic venous hypertension (idiopathic) with ulcer of right lower extremity: Secondary | ICD-10-CM | POA: Diagnosis not present

## 2019-03-21 NOTE — Progress Notes (Addendum)
[name], [name] ([id number]) Visit Report for 03/21/2019 Arrival Information Details Patient Name: [name], [name] Date of Service: 03/21/2019 1:30 PM Medical Record Number: [id number] Patient Account Number: [id number] Date of Birth/Sex: 1975-10-21 (43 y.o. M) Treating RN: Army Melia Primary Care Hooper Petteway: Ramonita Lab Other Clinician: Referring Wilborn Membreno: Ramonita Lab Treating Herrick Hartog/Extender: Melburn Hake, HOYT Weeks in Treatment: 2 Visit Information History Since Last Visit Added or deleted any medications: No Patient Arrived: Ambulatory Any new allergies or adverse reactions: No Arrival Time: 14:01 Had a fall or experienced change in No Accompanied By: self activities of daily living that may affect Transfer Assistance: None risk of falls: Patient Has Alerts: Yes Signs or symptoms of abuse/neglect since last visito No Patient Alerts: Non-compressible Hospitalized since last visit: No Has Dressing in Place as Prescribed: Yes Pain Present Now: No Electronic Signature(s) Signed: 03/21/2019 4:22:26 PM By: Army Melia Entered By: Army Melia on 03/21/2019 14:01:44 [name] [name], [name] [name] ([id number]) -------------------------------------------------------------------------------- Compression Therapy Details Patient Name: [name] [name], [name] [name] Date of Service: 03/21/2019 1:30 PM Medical Record Number: [id number] Patient Account Number: [id number] Date of Birth/Sex: Jul 31, 1976 (43 y.o. M) Treating RN: Army Melia Primary Care Pearlena Ow: Ramonita Lab Other Clinician: Referring Chibueze Beasley: Ramonita Lab Treating Alegandro Macnaughton/Extender: Melburn Hake, HOYT Weeks in Treatment: 2 Compression Therapy Performed for Wound Assessment: Wound #1 Right,Posterior Lower Leg Performed By: Clinician Army Melia, RN Compression Type: Four Layer Electronic Signature(s) Signed: 03/21/2019 4:22:26 PM By: Army Melia Entered By: Army Melia on 03/21/2019 14:26:02 Even, [name]  ([id number]) -------------------------------------------------------------------------------- Encounter Discharge Information Details Patient Name: [name] [name], [name] [name] Date of Service: 03/21/2019 1:30 PM Medical Record Number: [id number] Patient Account Number: [id number] Date of Birth/Sex: 12-22-1975 (43 y.o. M) Treating RN: Army Melia Primary Care Ellee Wawrzyniak: Ramonita Lab Other Clinician: Referring Cammie Faulstich: Ramonita Lab Treating Shyla Gayheart/Extender: Melburn Hake, HOYT Weeks in Treatment: 2 Encounter Discharge Information Items Discharge Condition: Stable Ambulatory Status: Ambulatory Discharge Destination: Home Transportation: Private Auto Accompanied By: self Schedule Follow-up Appointment: Yes Clinical Summary of Care: Electronic Signature(s) Signed: 03/23/2019 11:45:21 AM By: Army Melia Entered By: Army Melia on 03/23/2019 11:45:21 [name] [name], [name] [name] ([id number]) -------------------------------------------------------------------------------- Wound Assessment Details Patient Name: [name] [name], [name] [name] Date of Service: 03/21/2019 1:30 PM Medical Record Number: [id number] Patient Account Number: [id number] Date of Birth/Sex: 1976-04-12 (43 y.o. M) Treating RN: Army Melia Primary Care Laurynn Mccorvey: Ramonita Lab Other Clinician: Referring Lindley Stachnik: Ramonita Lab Treating Nini Cavan/Extender: Melburn Hake, HOYT Weeks in Treatment: 2 Wound Status Wound Number: 1 Primary Etiology: Lymphedema Wound Location: Right Lower Leg - Posterior Secondary Etiology: Venous Leg Ulcer Wounding Event: Gradually Appeared Wound Status: Open Date Acquired: 02/24/2019 Comorbid History: Hypertension Weeks Of Treatment: 2 Clustered Wound: No Wound Measurements Length: (cm) 15 Width: (cm) 27 Depth: (cm) 0.1 Area: (cm) 318.086 Volume: (cm) 31.809 % Reduction in Area: 11.2% % Reduction in Volume: 11.2% Epithelialization: None Tunneling: No Undermining: No Wound Description Full Thickness Without Exposed  Support Classification: Structures Wound Margin: Flat and Intact Exudate Large Amount: Exudate Type: Serous Exudate Color: amber Foul Odor After Cleansing: No Slough/Fibrino Yes Wound Bed Granulation Amount: Medium (34-66%) Exposed Structure Granulation Quality: Pale Fascia Exposed: No Necrotic Amount: Medium (34-66%) Fat Layer (Subcutaneous Tissue) Exposed: Yes Necrotic Quality: Adherent Slough Tendon Exposed: No Muscle Exposed: No Joint Exposed: No Bone Exposed: No Treatment Notes Wound #1 (Right, Posterior Lower Leg) Notes TCA, silvercel, abd, 4 layer wrap with unna to anchor Electronic Signature(s) Signed: 03/21/2019 4:22:26 PM By: Army Melia Entered By: Army Melia on 03/21/2019 14:25:39

## 2019-03-22 NOTE — Progress Notes (Signed)
[name] [name] ([id number]) Visit Report for 03/16/2019 Debridement Details Patient Name: [name] [name] Date of Service: 03/16/2019 10:15 AM Medical Record Number: [id number] Patient Account Number: [id number] Date of Birth/Sex: [birth year] (43 y.o. M) Treating RN: Huel Coventry Primary Care Provider: Daniel Nones Other Clinician: Referring Provider: Daniel Nones Treating Provider/Extender: Altamese Buckhead in Treatment: 2 Debridement Performed for Wound #1 Right,Posterior Lower Leg Assessment: Performed By: Physician Maxwell Caul, MD Debridement Type: Debridement Severity of Tissue Pre Fat layer exposed Debridement: Level of Consciousness (Pre- Awake and Alert procedure): Pre-procedure Verification/Time Yes - 10:26 Out Taken: Start Time: 10:26 Pain Control: Lidocaine Total Area Debrided (L x W): 18 (cm) x 29 (cm) = 522 (cm) Tissue and other material Slough, Subcutaneous, Skin: Dermis , Slough debrided: Level: Skin/Subcutaneous Tissue Debridement Description: Excisional Instrument: Curette Bleeding: Moderate Hemostasis Achieved: Pressure End Time: 10:30 Response to Treatment: Procedure was tolerated well Level of Consciousness Awake and Alert (Post-procedure): Post Debridement Measurements of Total Wound Length: (cm) 18 Width: (cm) 29 Depth: (cm) 0.2 Volume: (cm) 81.996 Character of Wound/Ulcer Post Debridement: Requires Further Debridement Severity of Tissue Post Debridement: Fat layer exposed Post Procedure Diagnosis Same as Pre-procedure Electronic Signature(s) Signed: 03/16/2019 6:06:19 PM By: Baltazar Najjar MD Signed: 03/21/2019 5:43:16 PM By: Elliot Gurney, BSN, RN, CWS, Kim RN, BSN Entered By: Baltazar Najjar on 03/16/2019 11:10:19 [name] [name] ([id number]) -------------------------------------------------------------------------------- HPI Details Patient Name: [name] [name] Date of Service: 03/16/2019 10:15 AM Medical Record Number: [id number] Patient Account  Number: [id number] Date of Birth/Sex: 07-25-1976 (43 y.o. M) Treating RN: Huel Coventry Primary Care Provider: Daniel Nones Other Clinician: Referring Provider: Daniel Nones Treating Provider/Extender: Altamese Sparks in Treatment: 2 History of Present Illness HPI Description: ADMISSION 03/02/2019 This is a 43 year old man who is not a diabetic. He states he was well up until 8 days ago when he felt fatigued and generally unwell while at work. He went home and discovered intense erythema in the right leg. This was painful. He did not make it into see his doctor until 3 days later. He was put on doxycycline. He had a duplex ultrasound done at interventional radiology that did not show reflux, DVT or superficial thrombophlebitis. He was put on doxycycline which she is still taking. He was put in an Radio broadcast assistant. He was reviewed again 2 days ago and referred here. He had an Radio broadcast assistant on when he came in here today. Patient states he is not systemically unwell but the leg is uncomfortable. He does not have a history of wounds on his legs. He does have some discoloration of the left leg which looks like chronic venous changes however he states that the right leg did not have this prior to any changes recently. I note that he has been seen by Dr. Graciela Husbands at the [facility] worked up for lower extremity edema. He had an echocardiogram. Past medical history includes hypertension, edema of both legs, obstructive sleep apnea, morbid obesity. Allergies, he is listed as being allergic to amoxicillin from 2015 and his primary doctor's office although I do not see this in epic. The patient does not remember anything about this. He does not remember being on amoxicillin. Socially; he works in a Education officer, community. But he denies getting his leg into contact with anything particularly problematic ABIs in our clinic were noncompressible bilaterally 03/04/19 upon evaluation today patient has come in for  reevaluation due to the fact that he had a significant right lower extremity cellulitis when he was seen  in the office on Wednesday, two days ago. Subsequently he did not want to go to the ER for likely admission and I'll be anabiotic therapy. For that reason we actually have placed him when he saw Dr. Leanord Hawkingobson Wednesday on linezolid which he has been taking since that time. Subsequently the culture come back showing gram- negative rods and therefore Cipro was also recommended by Dr. Leanord Hawkingobson although that has not been seen in yet that was just this morning. The final culture and sensitivity has not returned yet. No fevers, chills, nausea, or vomiting noted at this time. As far as the markings around the leg actually appears that their theme a has receded somewhat compared to what it showed during the evaluation today is ago and he also states that is not as tender to touch which is also good news. 6/24; the culture I did of what I thought was a blister in the lower part of this wound grew gram-negative's. This was Pseudomonas and Serratia. In response to that we put him on ciprofloxacin which will end in 2 days. He also had Lunesta lid which fortunately did not cost him too much. His leg is a lot better. There is no tenderness. He has some erythema continuing but this is not tender. He was referred to Dr. dew at [facility] vascular by his primary doctor. They felt that he had lymphedema and that lymphedema pumps would benefit. They are ordering a reflux study that should be done tomorrow. He has remained systemically well. Overall his cellulitis was extensive things look a lot better today than a week ago 7/1; continued copious amounts of adherent necrotic debris over the wound surface. This required an extensive debridement with a #5 curette today. We also vigorously washed this area off with Vashe solution. He will need to come back for a nurse change. This is likely secondary to extensive bioburden  over the wound area. We are using 3 layer compression he appears to tolerate this fairly well. The patient had reflux studies done. This showed no evidence of deep venous thrombosis in the right. Part of the exam was limited there was no evidence of superficial vein thrombosis and no evidence of chronic venous insufficiency. Electronic Signature(s) Signed: 03/16/2019 6:06:19 PM By: Baltazar Najjarobson, Michael MD CanoncitoSHAFFER, [name] ([id number]) Entered By: Baltazar Najjarobson, Michael on 03/16/2019 11:12:50 [name] [name] ([id number]) -------------------------------------------------------------------------------- Physical Exam Details Patient Name: [name] RepressSHAFFER, Tashi Date of Service: 03/16/2019 10:15 AM Medical Record Number: [id number] Patient Account Number: [id number] Date of Birth/Sex: August 15, 1976 (43 y.o. M) Treating RN: Huel CoventryWoody, Kim Primary Care Provider: Daniel NonesKLEIN, BERT Other Clinician: Referring Provider: Daniel NonesKLEIN, BERT Treating Provider/Extender: Maxwell CaulOBSON, MICHAEL G Weeks in Treatment: 2 Constitutional Patient is hypertensive.. Pulse regular and within target range for patient.Marland Kitchen. Respirations regular, non-labored and within target range.. Temperature is normal and within the target range for the patient.Marland Kitchen. appears in no distress. Cardiovascular Chronic stasis dermatitis and lymphedema. His edema is reasonably well controlled.. Notes Wound exam; wound exam; patient's lower extremity looks better in terms of edema and erythema. Appears to be some chronic erythema but this is nontender I suspect this is chronic stasis dermatitis rather than coexistent cellulitis. He has buildup of thick adherent necrotic debris over the wound surface. Using a #5 curette extensive debridement which was difficult for the patient because of discomfort and time-consuming. I see no evidence of active infection. Post debridement the wound was vigorously washed with Vashe solution Electronic Signature(s) Signed: 03/16/2019 6:06:19 PM By: Baltazar Najjarobson, Michael  MD Entered By: Baltazar Najjarobson, Michael  on 03/16/2019 11:15:11 Sidney, Labaron ([id number]) -------------------------------------------------------------------------------- Physician Orders Details Patient Name: ESPER, Neng Date of Service: 03/16/2019 10:15 AM Medical Record Number: [id number] Patient Account Number: [id number] Date of Birth/Sex: [birth year] (43 y.o. M) Treating RN: Huel Coventry Primary Care Provider: Daniel Nones Other Clinician: Referring Provider: Daniel Nones Treating Provider/Extender: Altamese Weston in Treatment: 2 Verbal / Phone Orders: No Diagnosis Coding Wound Cleansing Wound #1 Right,Posterior Lower Leg o Dial antibacterial soap, wash wounds, rinse and pat dry prior to dressing wounds o May shower with protection. o Other: - Wash with Vashe at dressing change. Anesthetic (add to Medication List) Wound #1 Right,Posterior Lower Leg o Topical Lidocaine 4% cream applied to wound bed prior to debridement (In Clinic Only). o PainEase-Instant Topical anesthetic Skin Barriers/Peri-Wound Care Wound #1 Right,Posterior Lower Leg o Triamcinolone Acetonide Ointment (TCA) Primary Wound Dressing Wound #1 Right,Posterior Lower Leg o Silver Alginate Secondary Dressing Wound #1 Right,Posterior Lower Leg o ABD pad Dressing Change Frequency Wound #1 Right,Posterior Lower Leg o Other: - Monday Follow-up Appointments Wound #1 Right,Posterior Lower Leg o Return Appointment in 1 week. o Nurse Visit as needed Edema Control Wound #1 Right,Posterior Lower Leg o 4-Layer Compression System - Right Lower Extremity o Elevate legs to the level of the heart and pump ankles as often as possible Additional Orders / Instructions o Other: - Out of work until the July 8th. Will re-evaluate at that time. [name], Roscoe ([id number]) Electronic Signature(s) Signed: 03/16/2019 6:06:19 PM By: Baltazar Najjar MD Signed: 03/21/2019 5:43:16 PM By: Elliot Gurney, BSN, RN, CWS, Kim RN,  BSN Entered By: Elliot Gurney, BSN, RN, CWS, Kim on 03/16/2019 10:47:08 [name] [name] ([id number]) -------------------------------------------------------------------------------- Problem List Details Patient Name: JARIN, CORNFIELD Date of Service: 03/16/2019 10:15 AM Medical Record Number: [id number] Patient Account Number: [id number] Date of Birth/Sex: 09-27-1975 (43 y.o. M) Treating RN: Huel Coventry Primary Care Provider: Daniel Nones Other Clinician: Referring Provider: Daniel Nones Treating Provider/Extender: Altamese Potlatch in Treatment: 2 Active Problems ICD-10 Evaluated Encounter Code Description Active Date Today Diagnosis L97.211 Non-pressure chronic ulcer of right calf limited to breakdown 03/02/2019 No Yes of skin L03.115 Cellulitis of right lower limb 03/02/2019 No Yes I87.311 Chronic venous hypertension (idiopathic) with ulcer of right 03/02/2019 No Yes lower extremity I89.0 Lymphedema, not elsewhere classified 03/09/2019 No Yes Inactive Problems Resolved Problems Electronic Signature(s) Signed: 03/16/2019 6:06:19 PM By: Baltazar Najjar MD Entered By: Baltazar Najjar on 03/16/2019 11:09:25 [name] [name] ([id number]) -------------------------------------------------------------------------------- Progress Note Details Patient Name: [name] Repress, [name] Date of Service: 03/16/2019 10:15 AM Medical Record Number: [id number] Patient Account Number: [id number] Date of Birth/Sex: Dec 13, 1975 (43 y.o. M) Treating RN: Huel Coventry Primary Care Provider: Daniel Nones Other Clinician: Referring Provider: Daniel Nones Treating Provider/Extender: Maxwell Caul Weeks in Treatment: 2 Subjective History of Present Illness (HPI) ADMISSION 03/02/2019 This is a 43 year old man who is not a diabetic. He states he was well up until 8 days ago when he felt fatigued and generally unwell while at work. He went home and discovered intense erythema in the right leg. This was painful. He did not make it into see his  doctor until 3 days later. He was put on doxycycline. He had a duplex ultrasound done at interventional radiology that did not show reflux, DVT or superficial thrombophlebitis. He was put on doxycycline which she is still taking. He was put in an Radio broadcast assistant. He was reviewed again 2 days ago and referred here. He had an Radio broadcast assistant on when he came in here today. Patient states  he is not systemically unwell but the leg is uncomfortable. He does not have a history of wounds on his legs. He does have some discoloration of the left leg which looks like chronic venous changes however he states that the right leg did not have this prior to any changes recently. I note that he has been seen by Dr. Caryl Comes at the [facility] worked up for lower extremity edema. He had an echocardiogram. Past medical history includes hypertension, edema of both legs, obstructive sleep apnea, morbid obesity. Allergies, he is listed as being allergic to amoxicillin from 2015 and his primary doctor's office although I do not see this in epic. The patient does not remember anything about this. He does not remember being on amoxicillin. Socially; he works in a Glass blower/designer. But he denies getting his leg into contact with anything particularly problematic ABIs in our clinic were noncompressible bilaterally 03/04/19 upon evaluation today patient has come in for reevaluation due to the fact that he had a significant right lower extremity cellulitis when he was seen in the office on Wednesday, two days ago. Subsequently he did not want to go to the ER for likely admission and I'll be anabiotic therapy. For that reason we actually have placed him when he saw Dr. Dellia Nims Wednesday on linezolid which he has been taking since that time. Subsequently the culture come back showing gram- negative rods and therefore Cipro was also recommended by Dr. Dellia Nims although that has not been seen in yet that was just this morning. The final  culture and sensitivity has not returned yet. No fevers, chills, nausea, or vomiting noted at this time. As far as the markings around the leg actually appears that their theme a has receded somewhat compared to what it showed during the evaluation today is ago and he also states that is not as tender to touch which is also good news. 6/24; the culture I did of what I thought was a blister in the lower part of this wound grew gram-negative's. This was Pseudomonas and Serratia. In response to that we put him on ciprofloxacin which will end in 2 days. He also had Lunesta lid which fortunately did not cost him too much. His leg is a lot better. There is no tenderness. He has some erythema continuing but this is not tender. He was referred to Dr. dew at vein and vascular by his primary doctor. They felt that he had lymphedema and that lymphedema pumps would benefit. They are ordering a reflux study that should be done tomorrow. He has remained systemically well. Overall his cellulitis was extensive things look a lot better today than a week ago 7/1; continued copious amounts of adherent necrotic debris over the wound surface. This required an extensive debridement with a #5 curette today. We also vigorously washed this area off with Vashe solution. He will need to come back for a nurse change. This is likely secondary to extensive bioburden over the wound area. We are using 3 layer compression he appears to tolerate this fairly well. The patient had reflux studies done. This showed no evidence of deep venous thrombosis in the right. Part of the exam was limited there was no evidence of superficial vein thrombosis and no evidence of chronic venous insufficiency. [name] [name] ([id number]) Objective Constitutional Patient is hypertensive.. Pulse regular and within target range for patient.Marland Kitchen Respirations regular, non-labored and within target range.. Temperature is normal and within the target range for  the patient.Marland Kitchen appears  in no distress. Vitals Time Taken: 10:00 AM, Height: 66 in, Weight: 442 lbs, BMI: 71.3, Temperature: 98.2 F, Pulse: 75 bpm, Respiratory Rate: 18 breaths/min, Blood Pressure: 183/84 mmHg. Cardiovascular Chronic stasis dermatitis and lymphedema. His edema is reasonably well controlled.. General Notes: Wound exam; wound exam; patient's lower extremity looks better in terms of edema and erythema. Appears to be some chronic erythema but this is nontender I suspect this is chronic stasis dermatitis rather than coexistent cellulitis. He has buildup of thick adherent necrotic debris over the wound surface. Using a #5 curette extensive debridement which was difficult for the patient because of discomfort and time-consuming. I see no evidence of active infection. Post debridement the wound was vigorously washed with Vashe solution Integumentary (Hair, Skin) Wound #1 status is Open. Original cause of wound was Gradually Appeared. The wound is located on the Right,Posterior Lower Leg. The wound measures 18cm length x 29cm width x 0.1cm depth; 409.978cm^2 area and 40.998cm^3 volume. There is Fat Layer (Subcutaneous Tissue) Exposed exposed. There is no tunneling or undermining noted. There is a large amount of serous drainage noted. The wound margin is flat and intact. There is medium (34-66%) pale granulation within the wound bed. There is a medium (34-66%) amount of necrotic tissue within the wound bed including Adherent Slough. Assessment Active Problems ICD-10 Non-pressure chronic ulcer of right calf limited to breakdown of skin Cellulitis of right lower limb Chronic venous hypertension (idiopathic) with ulcer of right lower extremity Lymphedema, not elsewhere classified Procedures Wound #1 Pre-procedure diagnosis of Wound #1 is a Lymphedema located on the Right,Posterior Lower Leg .Severity of Tissue Pre Debridement is: Fat layer exposed. There was a Excisional  Skin/Subcutaneous Tissue Debridement with a total area of 522 sq cm performed by Maxwell CaulOBSON, MICHAEL G, MD. With the following instrument(s): Curette Material removed includes Subcutaneous Tissue, Slough, and Skin: Dermis after achieving pain control using Lidocaine. No specimens were taken. A [name] [name] ([id number]) time out was conducted at 10:26, prior to the start of the procedure. A Moderate amount of bleeding was controlled with Pressure. The procedure was tolerated well. Post Debridement Measurements: 18cm length x 29cm width x 0.2cm depth; 81.996cm^3 volume. Character of Wound/Ulcer Post Debridement requires further debridement. Severity of Tissue Post Debridement is: Fat layer exposed. Post procedure Diagnosis Wound #1: Same as Pre-Procedure Plan Wound Cleansing: Wound #1 Right,Posterior Lower Leg: Dial antibacterial soap, wash wounds, rinse and pat dry prior to dressing wounds May shower with protection. Other: - Wash with Vashe at dressing change. Anesthetic (add to Medication List): Wound #1 Right,Posterior Lower Leg: Topical Lidocaine 4% cream applied to wound bed prior to debridement (In Clinic Only). PainEase-Instant Topical anesthetic Skin Barriers/Peri-Wound Care: Wound #1 Right,Posterior Lower Leg: Triamcinolone Acetonide Ointment (TCA) Primary Wound Dressing: Wound #1 Right,Posterior Lower Leg: Silver Alginate Secondary Dressing: Wound #1 Right,Posterior Lower Leg: ABD pad Dressing Change Frequency: Wound #1 Right,Posterior Lower Leg: Other: - Monday Follow-up Appointments: Wound #1 Right,Posterior Lower Leg: Return Appointment in 1 week. Nurse Visit as needed Edema Control: Wound #1 Right,Posterior Lower Leg: 4-Layer Compression System - Right Lower Extremity Elevate legs to the level of the heart and pump ankles as often as possible Additional Orders / Instructions: Other: - Out of work until the July 8th. Will re-evaluate at that time. #1 continue  TCA/silver alginate/ABDs/and I have increased him to 4 layer compression 2. We will have him back for a nurse visit on Monday. 3. May need to culture the wound surface/bioburden Electronic Signature(s) Signed: 03/16/2019  6:06:19 PM By: Baltazar Najjarobson, Michael MD [place]SHAFFER, [name] ([id number]) Entered By: Baltazar Najjarobson, Michael on 03/16/2019 11:17:01 [name] [name] ([id number]) -------------------------------------------------------------------------------- SuperBill Details Patient Name: [name] RepressSHAFFER, [name] Date of Service: 03/16/2019 Medical Record Number: [id number] Patient Account Number: [id number] Date of Birth/Sex: 07-Feb-1976 (43 y.o. M) Treating RN: Huel CoventryWoody, Kim Primary Care Provider: Daniel NonesKLEIN, BERT Other Clinician: Referring Provider: Daniel NonesKLEIN, BERT Treating Provider/Extender: Maxwell CaulOBSON, MICHAEL G Weeks in Treatment: 2 Diagnosis Coding ICD-10 Codes Code Description [phone] Non-pressure chronic ulcer of right calf limited to breakdown of skin L03.115 Cellulitis of right lower limb I87.311 Chronic venous hypertension (idiopathic) with ulcer of right lower extremity I89.0 Lymphedema, not elsewhere classified Facility Procedures CPT4 Code: [id number] Description: 11042 - DEB SUBQ TISSUE 20 SQ CM/< ICD-10 Diagnosis Description L97.211 Non-pressure chronic ulcer of right calf limited to breakdow I89.0 Lymphedema, not elsewhere classified Modifier: n of skin Quantity: 1 CPT4 Code: [id number] Description: 11045 - DEB SUBQ TISS EA ADDL 20CM ICD-10 Diagnosis Description L97.211 Non-pressure chronic ulcer of right calf limited to breakdow Modifier: n of skin Quantity: 26 Physician Procedures CPT4 Code: [id number] Description: 11042 - WC PHYS SUBQ TISS 20 SQ CM ICD-10 Diagnosis Description L97.211 Non-pressure chronic ulcer of right calf limited to breakdown I89.0 Lymphedema, not elsewhere classified Modifier: of skin Quantity: 1 CPT4 Code: [id number] Description: 11045 - WC PHYS SUBQ TISS EA ADDL 20 CM ICD-10 Diagnosis Description  L97.211 Non-pressure chronic ulcer of right calf limited to breakdown Modifier: of skin Quantity: 26 Electronic Signature(s) Signed: 03/16/2019 6:06:19 PM By: Baltazar Najjarobson, Michael MD Entered By: Baltazar Najjarobson, Michael on 03/16/2019 11:18:22

## 2019-03-22 NOTE — Progress Notes (Signed)
[name] [name], [name] [name] ([id number]) Visit Report for 03/16/2019 Arrival Information Details Patient Name: [name] [name], [name] [name] Date of Service: 03/16/2019 10:15 AM Medical Record Number: [id number] Patient Account Number: [id number] Date of Birth/Sex: Nov 25, 1975 (43 y.o. M) Treating RN: Arnette NorrisBiell, Kristina Primary Care Davison Ohms: Daniel NonesKLEIN, BERT Other Clinician: Referring Numair Masden: Daniel NonesKLEIN, BERT Treating Ruthellen Tippy/Extender: Altamese CarolinaOBSON, MICHAEL G Weeks in Treatment: 2 Visit Information History Since Last Visit Added or deleted any medications: No Patient Arrived: Ambulatory Any new allergies or adverse reactions: No Arrival Time: 10:01 Had a fall or experienced change in No Accompanied By: self activities of daily living that may affect Transfer Assistance: None risk of falls: Patient Identification Verified: Yes Signs or symptoms of abuse/neglect since last visito No Secondary Verification Process Yes Hospitalized since last visit: No Completed: Has Dressing in Place as Prescribed: Yes Patient Has Alerts: Yes Has Compression in Place as Prescribed: Yes Patient Alerts: Non- Pain Present Now: Yes compressible Electronic Signature(s) Signed: 03/17/2019 9:59:53 AM By: Arnette NorrisBiell, Kristina Entered By: Arnette NorrisBiell, Kristina on 03/16/2019 10:02:15 Depoy, Bernardo ([id number]) -------------------------------------------------------------------------------- Encounter Discharge Information Details Patient Name: [name] [name], [name] [name] Date of Service: 03/16/2019 10:15 AM Medical Record Number: [id number] Patient Account Number: [id number] Date of Birth/Sex: Nov 25, 1975 (43 y.o. M) Treating RN: Curtis Sitesorthy, Joanna Primary Care Saja Bartolini: Daniel NonesKLEIN, BERT Other Clinician: Referring Lindsay Soulliere: Daniel NonesKLEIN, BERT Treating Erina Hamme/Extender: Altamese CarolinaOBSON, MICHAEL G Weeks in Treatment: 2 Encounter Discharge Information Items Post Procedure Vitals Discharge Condition: Stable Temperature (F): 98.2 Ambulatory Status: Ambulatory Pulse (bpm): 75 Discharge Destination:  Home Respiratory Rate (breaths/min): 18 Transportation: Private Auto Blood Pressure (mmHg): 183/84 Accompanied By: self Schedule Follow-up Appointment: Yes Clinical Summary of Care: Electronic Signature(s) Signed: 03/16/2019 5:19:05 PM By: Curtis Sitesorthy, Joanna Entered By: Curtis Sitesorthy, Joanna on 03/16/2019 10:56:40 Toso, Rodolfo ([id number]) -------------------------------------------------------------------------------- Lower Extremity Assessment Details Patient Name: [name] [name], [name] [name] Date of Service: 03/16/2019 10:15 AM Medical Record Number: [id number] Patient Account Number: [id number] Date of Birth/Sex: Nov 25, 1975 (43 y.o. M) Treating RN: Arnette NorrisBiell, Kristina Primary Care Nadira Single: Daniel NonesKLEIN, BERT Other Clinician: Referring Nneoma Harral: Daniel NonesKLEIN, BERT Treating Ami Thornsberry/Extender: Maxwell CaulOBSON, MICHAEL G Weeks in Treatment: 2 Edema Assessment Assessed: [Left: No] [Right: No] [Left: Edema] [Right: :] Calf Left: Right: Point of Measurement: 33 cm From Medial Instep cm 50 cm Ankle Left: Right: Point of Measurement: 10 cm From Medial Instep cm 29.5 cm Vascular Assessment Pulses: Dorsalis Pedis Palpable: [Right:Yes] Posterior Tibial Palpable: [Right:Yes] Electronic Signature(s) Signed: 03/17/2019 9:59:53 AM By: Arnette NorrisBiell, Kristina Entered By: Arnette NorrisBiell, Kristina on 03/16/2019 10:09:11 Wieczorek, Tiger ([id number]) -------------------------------------------------------------------------------- Multi Wound Chart Details Patient Name: [name] [name], [name] [name] Date of Service: 03/16/2019 10:15 AM Medical Record Number: [id number] Patient Account Number: [id number] Date of Birth/Sex: Nov 25, 1975 (43 y.o. M) Treating RN: Huel CoventryWoody, Kim Primary Care Oseph Imburgia: Daniel NonesKLEIN, BERT Other Clinician: Referring Cyntha Brickman: Daniel NonesKLEIN, BERT Treating Leonore Frankson/Extender: Maxwell CaulOBSON, MICHAEL G Weeks in Treatment: 2 Vital Signs Height(in): 66 Pulse(bpm): 75 Weight(lbs): 442 Blood Pressure(mmHg): 183/84 Body Mass Index(BMI): 71 Temperature(F): 98.2 Respiratory  Rate 18 (breaths/min): Photos: [N/A:N/A] Wound Location: Right Lower Leg - Posterior N/A N/A Wounding Event: Gradually Appeared N/A N/A Primary Etiology: Lymphedema N/A N/A Secondary Etiology: Venous Leg Ulcer N/A N/A Comorbid History: Hypertension N/A N/A Date Acquired: 02/24/2019 N/A N/A Weeks of Treatment: 2 N/A N/A Wound Status: Open N/A N/A Measurements L x W x D 18x29x0.1 N/A N/A (cm) Area (cm) : 756.433409.978 N/A N/A Volume (cm) : 40.998 N/A N/A % Reduction in Area: -14.50% N/A N/A % Reduction in Volume: -14.50% N/A N/A Classification: Full Thickness Without N/A N/A Exposed Support Structures Exudate Amount: Large N/A N/A Exudate Type: Serous N/A N/A Exudate Color:  amber N/A N/A Wound Margin: Flat and Intact N/A N/A Granulation Amount: Medium (34-66%) N/A N/A Granulation Quality: Pale N/A N/A Necrotic Amount: Medium (34-66%) N/A N/A Exposed Structures: Fat Layer (Subcutaneous N/A N/A Tissue) Exposed: Yes Fascia: No Tendon: No Muscle: No Brager, Maximiano ([id number]) Joint: No Bone: No Epithelialization: None N/A N/A Debridement: Debridement - Excisional N/A N/A Pre-procedure 10:26 N/A N/A Verification/Time Out Taken: Pain Control: Lidocaine N/A N/A Tissue Debrided: Subcutaneous, Slough N/A N/A Level: Skin/Subcutaneous Tissue N/A N/A Debridement Area (sq cm): 522 N/A N/A Instrument: Curette N/A N/A Bleeding: Moderate N/A N/A Hemostasis Achieved: Pressure N/A N/A Debridement Treatment Procedure was tolerated well N/A N/A Response: Post Debridement 18x29x0.2 N/A N/A Measurements L x W x D (cm) Post Debridement Volume: 81.996 N/A N/A (cm) Procedures Performed: Debridement N/A N/A Treatment Notes Wound #1 (Right, Posterior Lower Leg) Notes TCA, silvercel, abd, 4 layer wrap with unna to anchor Electronic Signature(s) Signed: 03/16/2019 6:06:19 PM By: Baltazar Najjarobson, Michael MD Entered By: Baltazar Najjarobson, Michael on 03/16/2019 11:10:04 Tanabe, Anjelo  ([id number]) -------------------------------------------------------------------------------- Multi-Disciplinary Care Plan Details Patient Name: [name] [name], [name] [name] Date of Service: 03/16/2019 10:15 AM Medical Record Number: [id number] Patient Account Number: [id number] Date of Birth/Sex: [birth year] (43 y.o. M) Treating RN: Huel CoventryWoody, Kim Primary Care Kerby Borner: Daniel NonesKLEIN, BERT Other Clinician: Referring Binta Statzer: Daniel NonesKLEIN, BERT Treating Aundria Bitterman/Extender: Altamese CarolinaOBSON, MICHAEL G Weeks in Treatment: 2 Active Inactive Orientation to the Wound Care Program Nursing Diagnoses: Knowledge deficit related to the wound healing center program Goals: Patient/caregiver will verbalize understanding of the Wound Healing Center Program Date Initiated: 03/02/2019 Target Resolution Date: 03/02/2019 Goal Status: Active Interventions: Provide education on orientation to the wound center Notes: Soft Tissue Infection Nursing Diagnoses: Impaired tissue integrity Potential for infection: soft tissue Goals: Patient/caregiver will verbalize understanding of or measures to prevent infection and contamination in the home setting Date Initiated: 03/02/2019 Target Resolution Date: 03/02/2019 Goal Status: Active Patient's soft tissue infection will resolve Date Initiated: 03/02/2019 Target Resolution Date: 03/09/2019 Goal Status: Active Signs and symptoms of infection will be recognized early to allow for prompt treatment Date Initiated: 03/02/2019 Target Resolution Date: 03/09/2019 Goal Status: Active Interventions: Assess signs and symptoms of infection every visit Treatment Activities: Systemic antibiotics : 03/02/2019 Notes: Wound/Skin Impairment Resurreccion, Deloy ([id number]) Nursing Diagnoses: Impaired tissue integrity Knowledge deficit related to ulceration/compromised skin integrity Goals: Patient/caregiver will verbalize understanding of skin care regimen Date Initiated: 03/02/2019 Target Resolution Date: 03/02/2019 Goal  Status: Active Ulcer/skin breakdown will have a volume reduction of 30% by week 4 Date Initiated: 03/02/2019 Target Resolution Date: 03/30/2019 Goal Status: Active Interventions: Assess ulceration(s) every visit Provide education on ulcer and skin care Treatment Activities: Skin care regimen initiated : 03/02/2019 Topical wound management initiated : 03/02/2019 Notes: Electronic Signature(s) Signed: 03/21/2019 5:43:16 PM By: Elliot GurneyWoody, BSN, RN, CWS, Kim RN, BSN Entered By: Elliot GurneyWoody, BSN, RN, CWS, Kim on 03/16/2019 10:24:46 Deloney, Eryk ([id number]) -------------------------------------------------------------------------------- Pain Assessment Details Patient Name: [name] [name], [name] [name] Date of Service: 03/16/2019 10:15 AM Medical Record Number: [id number] Patient Account Number: [id number] Date of Birth/Sex: [birth year] (43 y.o. M) Treating RN: Arnette NorrisBiell, Kristina Primary Care Felcia Huebert: Daniel NonesKLEIN, BERT Other Clinician: Referring Donatella Walski: Daniel NonesKLEIN, BERT Treating Mei Suits/Extender: Altamese CarolinaOBSON, MICHAEL G Weeks in Treatment: 2 Active Problems Location of Pain Severity and Description of Pain Patient Has Paino No Site Locations Rate the pain. Current Pain Level: 3 Pain Management and Medication Current Pain Management: Electronic Signature(s) Signed: 03/17/2019 9:59:53 AM By: Arnette NorrisBiell, Kristina Entered By: Arnette NorrisBiell, Kristina on 03/16/2019 10:02:26 [place], Veer ([id number]) -------------------------------------------------------------------------------- Patient/Caregiver Education Details Patient Name: [name] [name], Hussien Date  of Service: 03/16/2019 10:15 AM Medical Record Number: [id number] Patient Account Number: [id number] Date of Birth/Gender: September 14, 1976 (43 y.o. M) Treating RN: Cornell Barman Primary Care Physician: Ramonita Lab Other Clinician: Referring Physician: Ramonita Lab Treating Physician/Extender: Tito Dine in Treatment: 2 Education Assessment Education Provided To: Patient Education Topics  Provided Venous: Handouts: Controlling Swelling with Multilayered Compression Wraps Methods: Demonstration, Explain/Verbal Responses: State content correctly Electronic Signature(s) Signed: 03/21/2019 5:43:16 PM By: Gretta Cool, BSN, RN, CWS, Kim RN, BSN Entered By: Gretta Cool, BSN, RN, CWS, Kim on 03/16/2019 10:40:12 Corne, Thi ([id number]) -------------------------------------------------------------------------------- Wound Assessment Details Patient Name: [name], Kenshawn Date of Service: 03/16/2019 10:15 AM Medical Record Number: [id number] Patient Account Number: [id number] Date of Birth/Sex: 1975-10-04 (43 y.o. M) Treating RN: Harold Barban Primary Care Kahdijah Errickson: Ramonita Lab Other Clinician: Referring Myishia Kasik: Ramonita Lab Treating Ghassan Coggeshall/Extender: Ricard Dillon Weeks in Treatment: 2 Wound Status Wound Number: 1 Primary Etiology: Lymphedema Wound Location: Right Lower Leg - Posterior Secondary Etiology: Venous Leg Ulcer Wounding Event: Gradually Appeared Wound Status: Open Date Acquired: 02/24/2019 Comorbid History: Hypertension Weeks Of Treatment: 2 Clustered Wound: No Photos Wound Measurements Length: (cm) 18 Width: (cm) 29 Depth: (cm) 0.1 Area: (cm) 409.978 Volume: (cm) 40.998 % Reduction in Area: -14.5% % Reduction in Volume: -14.5% Epithelialization: None Tunneling: No Undermining: No Wound Description Full Thickness Without Exposed Support Classification: Structures Wound Margin: Flat and Intact Exudate Large Amount: Exudate Type: Serous Exudate Color: amber Foul Odor After Cleansing: No Slough/Fibrino Yes Wound Bed Granulation Amount: Medium (34-66%) Exposed Structure Granulation Quality: Pale Fascia Exposed: No Necrotic Amount: Medium (34-66%) Fat Layer (Subcutaneous Tissue) Exposed: Yes Necrotic Quality: Adherent Slough Tendon Exposed: No Muscle Exposed: No Joint Exposed: No Bone Exposed: No Dubuc, Rickie ([id number]) Electronic  Signature(s) Signed: 03/17/2019 9:59:53 AM By: Harold Barban Entered By: Harold Barban on 03/16/2019 10:12:37 Cieslik, Rayon ([id number]) -------------------------------------------------------------------------------- Vitals Details Patient Name: [name], Keghan Date of Service: 03/16/2019 10:15 AM Medical Record Number: [id number] Patient Account Number: [id number] Date of Birth/Sex: 1976-01-07 (43 y.o. M) Treating RN: Harold Barban Primary Care Garmon Dehn: Ramonita Lab Other Clinician: Referring Tamela Elsayed: Ramonita Lab Treating Elier Zellars/Extender: Tito Dine in Treatment: 2 Vital Signs Time Taken: 10:00 Temperature (F): 98.2 Height (in): 66 Pulse (bpm): 75 Weight (lbs): 442 Respiratory Rate (breaths/min): 18 Body Mass Index (BMI): 71.3 Blood Pressure (mmHg): 183/84 Reference Range: 80 - 120 mg / dl Electronic Signature(s) Signed: 03/17/2019 9:59:53 AM By: Harold Barban Entered By: Harold Barban on 03/16/2019 10:05:23

## 2019-03-23 ENCOUNTER — Other Ambulatory Visit: Payer: Self-pay

## 2019-03-23 ENCOUNTER — Encounter: Payer: BC Managed Care – PPO | Admitting: Internal Medicine

## 2019-03-23 DIAGNOSIS — L97211 Non-pressure chronic ulcer of right calf limited to breakdown of skin: Secondary | ICD-10-CM | POA: Diagnosis not present

## 2019-03-23 DIAGNOSIS — I87311 Chronic venous hypertension (idiopathic) with ulcer of right lower extremity: Secondary | ICD-10-CM | POA: Diagnosis not present

## 2019-03-23 DIAGNOSIS — S81801A Unspecified open wound, right lower leg, initial encounter: Secondary | ICD-10-CM | POA: Diagnosis not present

## 2019-03-23 DIAGNOSIS — I890 Lymphedema, not elsewhere classified: Secondary | ICD-10-CM | POA: Diagnosis not present

## 2019-03-23 DIAGNOSIS — L03115 Cellulitis of right lower limb: Secondary | ICD-10-CM | POA: Diagnosis not present

## 2019-03-24 NOTE — Progress Notes (Signed)
[name], Selby ([id number]) Visit Report for 03/23/2019 Debridement Details Patient Name: [name] Walker, [name] Walker Date of Service: 03/23/2019 2:45 PM Medical Record Number: [id number] Patient Account Number: [id number] Date of Birth/Sex: 24-Apr-1976 (43 y.o. M) Treating RN: Huel CoventryWoody, Kim Primary Care Provider: Daniel NonesKLEIN, BERT Other Clinician: Referring Provider: Daniel NonesKLEIN, BERT Treating Provider/Extender: Altamese CarolinaOBSON, MICHAEL G Weeks in Treatment: 3 Debridement Performed for Wound #1 Right,Posterior Lower Leg Assessment: Performed By: Physician Maxwell CaulOBSON, MICHAEL G, MD Debridement Type: Debridement Severity of Tissue Pre Fat layer exposed Debridement: Level of Consciousness (Pre- Awake and Alert procedure): Pre-procedure Verification/Time Yes - 15:00 Out Taken: Start Time: 15:00 Pain Control: Lidocaine Total Area Debrided (L x W): 16 (cm) x 3 (cm) = 48 (cm) Tissue and other material Viable, Non-Viable, Slough, Subcutaneous, Slough debrided: Level: Skin/Subcutaneous Tissue Debridement Description: Excisional Instrument: Curette Bleeding: Moderate Hemostasis Achieved: Pressure End Time: 15:07 Response to Treatment: Procedure was tolerated well Level of Consciousness Awake and Alert (Post-procedure): Post Debridement Measurements of Total Wound Length: (cm) 15 Width: (cm) 16 Depth: (cm) 0.2 Volume: (cm) 37.699 Character of Wound/Ulcer Post Debridement: Stable Severity of Tissue Post Debridement: Fat layer exposed Post Procedure Diagnosis Same as Pre-procedure Electronic Signature(s) Signed: 03/23/2019 5:45:20 PM By: Baltazar Najjarobson, Michael MD Signed: 03/23/2019 5:54:22 PM By: Elliot GurneyWoody, BSN, RN, CWS, Kim RN, BSN Entered By: Baltazar Najjarobson, Michael on 03/23/2019 15:39:27 Mendia, Rashed ([id number]) -------------------------------------------------------------------------------- HPI Details Patient Name: [name] Walker, Gustave Date of Service: 03/23/2019 2:45 PM Medical Record Number: [id number] Patient Account Number: [id number] Date  of Birth/Sex: 24-Apr-1976 (43 y.o. M) Treating RN: Huel CoventryWoody, Kim Primary Care Provider: Daniel NonesKLEIN, BERT Other Clinician: Referring Provider: Daniel NonesKLEIN, BERT Treating Provider/Extender: Altamese CarolinaOBSON, MICHAEL G Weeks in Treatment: 3 History of Present Illness HPI Description: ADMISSION 03/02/2019 This is a 43 year old man who is not a diabetic. He states he was well up until 8 days ago when he felt fatigued and generally unwell while at work. He went home and discovered intense erythema in the right leg. This was painful. He did not make it into see his doctor until 3 days later. He was put on doxycycline. He had a duplex ultrasound done at [facility]al radiology that did not show reflux, DVT or superficial thrombophlebitis. He was put on doxycycline which she is still taking. He was put in an Radio broadcast assistantUnna boot. He was reviewed again 2 days ago and referred here. He had an Radio broadcast assistantUnna boot on when he came in here today. Patient states he is not systemically unwell but the leg is uncomfortable. He does not have a history of wounds on his legs. He does have some discoloration of the left leg which looks like chronic venous changes however he states that the right leg did not have this prior to any changes recently. I note that he has been seen by Dr. Graciela [facility]bandsKlein at the [facility] worked [facility] for lower extremity edema. He had an echocardiogram. Past medical history includes hypertension, edema of both legs, obstructive sleep apnea, morbid obesity. Allergies, he is listed as being allergic to amoxicillin from 2015 and his primary doctor's office although I do not see this in epic. The patient does not remember anything about this. He does not remember being on amoxicillin. Socially; he works in a Education officer, communitysewage and water treatment. But he denies getting his leg into contact with anything particularly problematic ABIs in our clinic were noncompressible bilaterally 03/04/19 upon evaluation today patient has come in for reevaluation due to the  fact that he had a significant right lower extremity cellulitis when he was seen in the office  on Wednesday, two days ago. Subsequently he did not want to go to the ER for likely admission and I'll be anabiotic therapy. For that reason we actually have placed him when he saw Dr. Dellia Nims Wednesday on linezolid which he has been taking since that time. Subsequently the culture come back showing gram- negative rods and therefore Cipro was also recommended by Dr. Dellia Nims although that has not been seen in yet that was just this morning. The final culture and sensitivity has not returned yet. No fevers, chills, nausea, or vomiting noted at this time. As far as the markings around the leg actually appears that their theme a has receded somewhat compared to what it showed during the evaluation today is ago and he also states that is not as tender to touch which is also good news. 6/24; the culture I did of what I thought was a blister in the lower part of this wound grew gram-negative's. This was Pseudomonas and Serratia. In response to that we put him on ciprofloxacin which will end in 2 days. He also had Lunesta lid which fortunately did not cost him too much. His leg is a lot better. There is no tenderness. He has some erythema continuing but this is not tender. He was referred to Dr. dew at [facility] by his primary doctor. They felt that he had lymphedema and that lymphedema pumps would benefit. They are ordering a reflux study that should be done tomorrow. He has remained systemically well. Overall his cellulitis was extensive things look a lot better today than a week ago 7/1; continued copious amounts of adherent necrotic debris over the wound surface. This required an extensive debridement with a #5 curette today. We also vigorously washed this area off with Vashe solution. He will need to come back for a nurse change. This is likely secondary to extensive bioburden over the wound area. We are  using 3 layer compression he appears to tolerate this fairly well. The patient had reflux studies done. This showed no evidence of deep venous thrombosis in the right. Part of the exam was limited there was no evidence of superficial vein thrombosis and no evidence of chronic venous insufficiency. 7/8; most of the patient's wound had actually begin to epithelialize. He still had probably about 30% the required debridement. using silver alginate Petrey, Tenzin ([id number]) Electronic Signature(s) Signed: 03/23/2019 5:45:20 PM By: Linton Ham MD Entered By: Linton Ham on 03/23/2019 15:41:01 Winn, Jodie ([id number]) -------------------------------------------------------------------------------- Physical Exam Details Patient Name: [name], Donnald Date of Service: 03/23/2019 2:45 PM Medical Record Number: [id number] Patient Account Number: [id number] Date of Birth/Sex: Feb 16, 1976 (43 y.o. M) Treating RN: Cornell Barman Primary Care Provider: Ramonita Lab Other Clinician: Referring Provider: Ramonita Lab Treating Provider/Extender: Tito Dine in Treatment: 3 Constitutional Patient is hypertensive.. Pulse regular and within target range for patient.Marland Kitchen Respirations regular, non-labored and within target range.. Temperature is normal and within the target range for the patient.Marland Kitchen appears in no distress. Notes Wound exam; patient's lower extremity looks well controlled in terms of edema. There is still some erythema which is worse than his left side however I think that is better as well. Most of the surface of the damage is epithelialized. There is copious amounts of dried flaking denuded skin but I think we are coming along nicely. Using a #5 curette I debrided the remaining necrotic surface off the wound bed hemostasis with a pressure dressing Electronic Signature(s) Signed: 03/23/2019 5:45:20 PM By: Linton Ham MD Entered By:  Baltazar Najjar on 03/23/2019 15:43:04 Insco, Shaunn  ([id number]) -------------------------------------------------------------------------------- Physician Orders Details Patient Name: DISANO, Jamorion Date of Service: 03/23/2019 2:45 PM Medical Record Number: [id number] Patient Account Number: [id number] Date of Birth/Sex: 11-Aug-1976 (43 y.o. M) Treating RN: Huel Coventry Primary Care Provider: Daniel Nones Other Clinician: Referring Provider: Daniel Nones Treating Provider/Extender: Altamese Malone in Treatment: 3 Verbal / Phone Orders: No Diagnosis Coding Wound Cleansing Wound #1 Right,Posterior Lower Leg o Dial antibacterial soap, wash wounds, rinse and pat dry prior to dressing wounds o May shower with protection. o Other: - Wash with Vashe at dressing change. Anesthetic (add to Medication List) Wound #1 Right,Posterior Lower Leg o Topical Lidocaine 4% cream applied to wound bed prior to debridement (In Clinic Only). o PainEase-Instant Topical anesthetic Skin Barriers/Peri-Wound Care Wound #1 Right,Posterior Lower Leg o Triamcinolone Acetonide Ointment (TCA) Primary Wound Dressing Wound #1 Right,Posterior Lower Leg o Silver Alginate Secondary Dressing Wound #1 Right,Posterior Lower Leg o ABD pad Dressing Change Frequency Wound #1 Right,Posterior Lower Leg o Other: - Monday Follow-up Appointments Wound #1 Right,Posterior Lower Leg o Return Appointment in 1 week. o Nurse Visit as needed Edema Control Wound #1 Right,Posterior Lower Leg o 4-Layer Compression System - Right Lower Extremity o Elevate legs to the level of the heart and pump ankles as often as possible Additional Orders / Instructions o Other: - Out of work until the July 17th. Will re-evaluate at that time. CRESCENZO, Caio ([id number]) Electronic Signature(s) Signed: 03/23/2019 5:45:20 PM By: Baltazar Najjar MD Signed: 03/23/2019 5:54:22 PM By: Elliot Gurney, BSN, RN, CWS, Kim RN, BSN Entered By: Elliot Gurney, BSN, RN, CWS, Kim on 03/23/2019  15:09:51 Gordin, Court ([id number]) -------------------------------------------------------------------------------- Problem List Details Patient Name: Arlyss Repress, Hong Date of Service: 03/23/2019 2:45 PM Medical Record Number: [id number] Patient Account Number: [id number] Date of Birth/Sex: 04-13-1976 (43 y.o. M) Treating RN: Huel Coventry Primary Care Provider: Daniel Nones Other Clinician: Referring Provider: Daniel Nones Treating Provider/Extender: Altamese Sadorus in Treatment: 3 Active Problems ICD-10 Evaluated Encounter Code Description Active Date Today Diagnosis L97.211 Non-pressure chronic ulcer of right calf limited to breakdown 03/02/2019 No Yes of skin L03.115 Cellulitis of right lower limb 03/02/2019 No Yes I87.311 Chronic venous hypertension (idiopathic) with ulcer of right 03/02/2019 No Yes lower extremity I89.0 Lymphedema, not elsewhere classified 03/09/2019 No Yes Inactive Problems Resolved Problems Electronic Signature(s) Signed: 03/23/2019 5:45:20 PM By: Baltazar Najjar MD Entered By: Baltazar Najjar on 03/23/2019 15:38:10 Gasaway, Ankush ([id number]) -------------------------------------------------------------------------------- Progress Note Details Patient Name: Arlyss Repress, Jarvin Date of Service: 03/23/2019 2:45 PM Medical Record Number: [id number] Patient Account Number: [id number] Date of Birth/Sex: [birth year] (43 y.o. M) Treating RN: Huel Coventry Primary Care Provider: Daniel Nones Other Clinician: Referring Provider: Daniel Nones Treating Provider/Extender: Maxwell Caul Weeks in Treatment: 3 Subjective History of Present Illness (HPI) ADMISSION 03/02/2019 This is a 43 year old man who is not a diabetic. He states he was well up until 8 days ago when he felt fatigued and generally unwell while at work. He went home and discovered intense erythema in the right leg. This was painful. He did not make it into see his doctor until 3 days later. He was put on doxycycline. He  had a duplex ultrasound done at interventional radiology that did not show reflux, DVT or superficial thrombophlebitis. He was put on doxycycline which she is still taking. He was put in an Radio broadcast assistant. He was reviewed again 2 days ago and referred here. He had an Radio broadcast assistant on when he came in here  today. Patient states he is not systemically unwell but the leg is uncomfortable. He does not have a history of wounds on his legs. He does have some discoloration of the left leg which looks like chronic venous changes however he states that the right leg did not have this prior to any changes recently. I note that he has been seen by Dr. Graciela HusbandsKlein at the [facility] worked up for lower extremity edema. He had an echocardiogram. Past medical history includes hypertension, edema of both legs, obstructive sleep apnea, morbid obesity. Allergies, he is listed as being allergic to amoxicillin from 2015 and his primary doctor's office although I do not see this in epic. The patient does not remember anything about this. He does not remember being on amoxicillin. Socially; he works in a Education officer, communitysewage and water treatment. But he denies getting his leg into contact with anything particularly problematic ABIs in our clinic were noncompressible bilaterally 03/04/19 upon evaluation today patient has come in for reevaluation due to the fact that he had a significant right lower extremity cellulitis when he was seen in the office on Wednesday, two days ago. Subsequently he did not want to go to the ER for likely admission and I'll be anabiotic therapy. For that reason we actually have placed him when he saw Dr. Leanord Hawkingobson Wednesday on linezolid which he has been taking since that time. Subsequently the culture come back showing gram- negative rods and therefore Cipro was also recommended by Dr. Leanord Hawkingobson although that has not been seen in yet that was just this morning. The final culture and sensitivity has not returned yet. No fevers,  chills, nausea, or vomiting noted at this time. As far as the markings around the leg actually appears that their theme a has receded somewhat compared to what it showed during the evaluation today is ago and he also states that is not as tender to touch which is also good news. 6/24; the culture I did of what I thought was a blister in the lower part of this wound grew gram-negative's. This was Pseudomonas and Serratia. In response to that we put him on ciprofloxacin which will end in 2 days. He also had Lunesta lid which fortunately did not cost him too much. His leg is a lot better. There is no tenderness. He has some erythema continuing but this is not tender. He was referred to Dr. dew at [facility] by his primary doctor. They felt that he had lymphedema and that lymphedema pumps would benefit. They are ordering a reflux study that should be done tomorrow. He has remained systemically well. Overall his cellulitis was extensive things look a lot better today than a week ago 7/1; continued copious amounts of adherent necrotic debris over the wound surface. This required an extensive debridement with a #5 curette today. We also vigorously washed this area off with Vashe solution. He will need to come back for a nurse change. This is likely secondary to extensive bioburden over the wound area. We are using 3 layer compression he appears to tolerate this fairly well. The patient had reflux studies done. This showed no evidence of deep venous thrombosis in the right. Part of the exam was limited there was no evidence of superficial vein thrombosis and no evidence of chronic venous insufficiency. 7/8; most of the patient's wound had actually begin to epithelialize. He still had probably about 30% the required debridement. using silver alginate Mersman, Drayke ([id number]) Objective Constitutional Patient is hypertensive.. Pulse regular  and within target range for patient.Marland Kitchen. Respirations regular,  non-labored and within target range.. Temperature is normal and within the target range for the patient.Marland Kitchen. appears in no distress. Vitals Time Taken: 2:41 PM, Height: 66 in, Weight: 442 lbs, BMI: 71.3, Temperature: 98.7 F, Pulse: 98 bpm, Respiratory Rate: 16 breaths/min, Blood Pressure: 162/84 mmHg. General Notes: Wound exam; patient's lower extremity looks well controlled in terms of edema. There is still some erythema which is worse than his left side however I think that is better as well. Most of the surface of the damage is epithelialized. There is copious amounts of dried flaking denuded skin but I think we are coming along nicely. Using a #5 curette I debrided the remaining necrotic surface off the wound bed hemostasis with a pressure dressing Integumentary (Hair, Skin) Wound #1 status is Open. Original cause of wound was Gradually Appeared. The wound is located on the Right,Posterior Lower Leg. The wound measures 16cm length x 15cm width x 0.1cm depth; 188.496cm^2 area and 18.85cm^3 volume. There is Fat Layer (Subcutaneous Tissue) Exposed exposed. There is no tunneling or undermining noted. There is a large amount of serosanguineous drainage noted. The wound margin is flat and intact. There is medium (34-66%) pale granulation within the wound bed. There is a medium (34-66%) amount of necrotic tissue within the wound bed including Adherent Slough. Assessment Active Problems ICD-10 Non-pressure chronic ulcer of right calf limited to breakdown of skin Cellulitis of right lower limb Chronic venous hypertension (idiopathic) with ulcer of right lower extremity Lymphedema, not elsewhere classified Procedures Wound #1 Pre-procedure diagnosis of Wound #1 is a Lymphedema located on the Right,Posterior Lower Leg .Severity of Tissue Pre Debridement is: Fat layer exposed. There was a Excisional Skin/Subcutaneous Tissue Debridement with a total area of 48 sq cm performed by Maxwell CaulOBSON, MICHAEL G,  MD. With the following instrument(s): Curette to remove Viable and Non-Viable tissue/material. Material removed includes Subcutaneous Tissue and Slough and after achieving pain control using Lidocaine. No specimens were taken. A time out was conducted at 15:00, prior to the start of the procedure. A Moderate amount of Pendelton, Helios ([id number]) bleeding was controlled with Pressure. The procedure was tolerated well. Post Debridement Measurements: 15cm length x 16cm width x 0.2cm depth; 37.699cm^3 volume. Character of Wound/Ulcer Post Debridement is stable. Severity of Tissue Post Debridement is: Fat layer exposed. Post procedure Diagnosis Wound #1: Same as Pre-Procedure Plan Wound Cleansing: Wound #1 Right,Posterior Lower Leg: Dial antibacterial soap, wash wounds, rinse and pat dry prior to dressing wounds May shower with protection. Other: - Wash with Vashe at dressing change. Anesthetic (add to Medication List): Wound #1 Right,Posterior Lower Leg: Topical Lidocaine 4% cream applied to wound bed prior to debridement (In Clinic Only). PainEase-Instant Topical anesthetic Skin Barriers/Peri-Wound Care: Wound #1 Right,Posterior Lower Leg: Triamcinolone Acetonide Ointment (TCA) Primary Wound Dressing: Wound #1 Right,Posterior Lower Leg: Silver Alginate Secondary Dressing: Wound #1 Right,Posterior Lower Leg: ABD pad Dressing Change Frequency: Wound #1 Right,Posterior Lower Leg: Other: - Monday Follow-up Appointments: Wound #1 Right,Posterior Lower Leg: Return Appointment in 1 week. Nurse Visit as needed Edema Control: Wound #1 Right,Posterior Lower Leg: 4-Layer Compression System - Right Lower Extremity Elevate legs to the level of the heart and pump ankles as often as possible Additional Orders / Instructions: Other: - Out of work until the July 17th. Will re-evaluate at that time. 1. TCA/skin lubricant/4-layer compression 2. Most of this is epithelializing. 3. We gave him his  measurements for compression stockings which she is going  to need especially while he is at work Psychologist, prison and probation services) Signed: 03/23/2019 5:45:20 PM By: Baltazar Najjar MD Entered By: Baltazar Najjar on 03/23/2019 15:46:15 Jamie, Julious ([id number]) -------------------------------------------------------------------------------- SuperBill Details Patient Name: Arlyss Repress, Antron Date of Service: 03/23/2019 Medical Record Number: [id number] Patient Account Number: [id number] Date of Birth/Sex: 1975/12/25 (43 y.o. M) Treating RN: Huel Coventry Primary Care Provider: Daniel Nones Other Clinician: Referring Provider: Daniel Nones Treating Provider/Extender: Maxwell Caul Weeks in Treatment: 3 Diagnosis Coding ICD-10 Codes Code Description [id number] Non-pressure chronic ulcer of right calf limited to breakdown of skin L03.115 Cellulitis of right lower limb I87.311 Chronic venous hypertension (idiopathic) with ulcer of right lower extremity I89.0 Lymphedema, not elsewhere classified Facility Procedures CPT4 Code: [id number] Description: 11042 - DEB SUBQ TISSUE 20 SQ CM/< ICD-10 Diagnosis Description L97.211 Non-pressure chronic ulcer of right calf limited to breakdow Modifier: n of skin Quantity: 1 CPT4 Code: [id number] Description: 11045 - DEB SUBQ TISS EA ADDL 20CM ICD-10 Diagnosis Description L97.211 Non-pressure chronic ulcer of right calf limited to breakdow Modifier: n of skin Quantity: 2 Physician Procedures CPT4 Code: [id number] Description: 11042 - WC PHYS SUBQ TISS 20 SQ CM ICD-10 Diagnosis Description L97.211 Non-pressure chronic ulcer of right calf limited to breakdown Modifier: of skin Quantity: 1 CPT4 Code: [id number] Description: 11045 - WC PHYS SUBQ TISS EA ADDL 20 CM ICD-10 Diagnosis Description L97.211 Non-pressure chronic ulcer of right calf limited to breakdown Modifier: of skin Quantity: 2 Electronic Signature(s) Signed: 03/23/2019 5:45:20 PM By: Baltazar Najjar MD Entered By: Baltazar Najjar on 03/23/2019 15:46:35

## 2019-03-24 NOTE — Progress Notes (Signed)
[name] Walker, [name] Walker ([id number]) Visit Report for 03/23/2019 Arrival Information Details Patient Name: [name] Walker, [name] Walker Date of Service: 03/23/2019 2:45 PM Medical Record Number: [id number] Patient Account Number: [id number] Date of Birth/Sex: 1976/01/29 (43 [name] Walker.o. M) Treating RN: Rodell PernaScott, Dajea Primary Care Berneda Piccininni: Daniel NonesKLEIN, BERT Other Clinician: Referring Arlene Genova: Daniel NonesKLEIN, BERT Treating Chasty Randal/Extender: Altamese CarolinaOBSON, MICHAEL G Weeks in Treatment: 3 Visit Information History Since Last Visit Added or deleted any medications: No Patient Arrived: Ambulatory Any new allergies or adverse reactions: No Arrival Time: 14:40 Had a fall or experienced change in No Accompanied By: self activities of daily living that may affect Transfer Assistance: None risk of falls: Patient Has Alerts: Yes Signs or symptoms of abuse/neglect since last visito No Patient Alerts: Non-compressible Hospitalized since last visit: No Has Dressing in Place as Prescribed: Yes Pain Present Now: No Electronic Signature(s) Signed: 03/24/2019 2:48:20 PM By: Rodell PernaScott, Dajea Entered By: Rodell PernaScott, Dajea on 03/23/2019 14:40:56 [name] Walker ([id number]) -------------------------------------------------------------------------------- Encounter Discharge Information Details Patient Name: [name] Walker, [name] Walker Date of Service: 03/23/2019 2:45 PM Medical Record Number: [id number] Patient Account Number: [id number] Date of Birth/Sex: 1976/01/29 (43 [name] Walker.o. M) Treating RN: Rodell PernaScott, Dajea Primary Care Glady Ouderkirk: Daniel NonesKLEIN, BERT Other Clinician: Referring Nyjai Graff: Daniel NonesKLEIN, BERT Treating Ramal Eckhardt/Extender: Altamese CarolinaOBSON, MICHAEL G Weeks in Treatment: 3 Encounter Discharge Information Items Post Procedure Vitals Discharge Condition: Stable Temperature (F): 98.7 Ambulatory Status: Ambulatory Pulse (bpm): 98 Discharge Destination: Home Respiratory Rate (breaths/min): 16 Transportation: Private Auto Blood Pressure (mmHg): 162/84 Accompanied By: self Schedule Follow-up Appointment:  Yes Clinical Summary of Care: Electronic Signature(s) Signed: 03/24/2019 2:48:20 PM By: Rodell PernaScott, Dajea Entered By: Rodell PernaScott, Dajea on 03/23/2019 15:27:27 [name], [name] Walker ([id number]) -------------------------------------------------------------------------------- Lower Extremity Assessment Details Patient Name: [name] Walker, [name] Walker Date of Service: 03/23/2019 2:45 PM Medical Record Number: [id number] Patient Account Number: [id number] Date of Birth/Sex: 1976/01/29 (43 [name] Walker.o. M) Treating RN: Rodell PernaScott, Dajea Primary Care Dylan Monforte: Daniel NonesKLEIN, BERT Other Clinician: Referring Angie Hogg: Daniel NonesKLEIN, BERT Treating Tykesha Konicki/Extender: Maxwell CaulOBSON, MICHAEL G Weeks in Treatment: 3 Edema Assessment Assessed: [Left: No] [Right: No] Edema: [Left: N] [Right: o] Calf Left: Right: Point of Measurement: 30 cm From Medial Instep cm 46 cm Ankle Left: Right: Point of Measurement: 10 cm From Medial Instep cm 25 cm Vascular Assessment Pulses: Dorsalis Pedis Palpable: [Right:Yes] Electronic Signature(s) Signed: 03/24/2019 2:48:20 PM By: Rodell PernaScott, Dajea Entered By: Rodell PernaScott, Dajea on 03/23/2019 15:16:02 [name] Walker ([id number]) -------------------------------------------------------------------------------- Multi Wound Chart Details Patient Name: [name] Walker, [name] Walker Date of Service: 03/23/2019 2:45 PM Medical Record Number: [id number] Patient Account Number: [id number] Date of Birth/Sex: 1976/01/29 (43 [name] Walker.o. M) Treating RN: Huel CoventryWoody, Kim Primary Care Ilai Hiller: Daniel NonesKLEIN, BERT Other Clinician: Referring Jael Kostick: Daniel NonesKLEIN, BERT Treating Danija Gosa/Extender: Altamese CarolinaOBSON, MICHAEL G Weeks in Treatment: 3 Vital Signs Height(in): 66 Pulse(bpm): 98 Weight(lbs): 442 Blood Pressure(mmHg): 162/84 Body Mass Index(BMI): 71 Temperature(F): 98.7 Respiratory Rate 16 (breaths/min): Photos: [N/A:N/A] Wound Location: Right Lower Leg - Posterior N/A N/A Wounding Event: Gradually Appeared N/A N/A Primary Etiology: Lymphedema N/A N/A Secondary Etiology: Venous Leg Ulcer N/A  N/A Comorbid History: Hypertension N/A N/A Date Acquired: 02/24/2019 N/A N/A Weeks of Treatment: 3 N/A N/A Wound Status: Open N/A N/A Measurements L x W x D 16x15x0.1 N/A N/A (cm) Area (cm) : 188.496 N/A N/A Volume (cm) : 18.85 N/A N/A % Reduction in Area: 47.40% N/A N/A % Reduction in Volume: 47.40% N/A N/A Classification: Full Thickness Without N/A N/A Exposed Support Structures Exudate Amount: Large N/A N/A Exudate Type: Serosanguineous N/A N/A Exudate Color: red, brown N/A N/A Wound Margin: Flat and Intact N/A N/A Granulation Amount: Medium (34-66%) N/A N/A Granulation Quality: Pale  N/A N/A Necrotic Amount: Medium (34-66%) N/A N/A Exposed Structures: Fat Layer (Subcutaneous N/A N/A Tissue) Exposed: Yes Fascia: No Tendon: No Muscle: No [name] Walker ([id number]) Joint: No Bone: No Epithelialization: None N/A N/A Debridement: Debridement - Excisional N/A N/A Pre-procedure 15:00 N/A N/A Verification/Time Out Taken: Pain Control: Lidocaine N/A N/A Tissue Debrided: Subcutaneous, Slough N/A N/A Level: Skin/Subcutaneous Tissue N/A N/A Debridement Area (sq cm): 80 N/A N/A Instrument: Curette N/A N/A Bleeding: Moderate N/A N/A Hemostasis Achieved: Pressure N/A N/A Debridement Treatment Procedure was tolerated well N/A N/A Response: Post Debridement 15x16x0.2 N/A N/A Measurements L x W x D (cm) Post Debridement Volume: 37.699 N/A N/A (cm) Procedures Performed: Debridement N/A N/A Treatment Notes Wound #1 (Right, Posterior Lower Leg) Notes TCA, silvercel, abd, 4 layer wrap with unna to anchor Electronic Signature(s) Signed: 03/23/2019 5:45:20 PM By: Linton Ham MD Entered By: Linton Ham on 03/23/2019 15:38:21 Nora, Giacomo ([id number]) -------------------------------------------------------------------------------- Multi-Disciplinary Care Plan Details Patient Name: [name] Walker, [name] Walker Date of Service: 03/23/2019 2:45 PM Medical Record Number: [id number] Patient  Account Number: [id number] Date of Birth/Sex: [birth year] (28 [name] Walker.o. M) Treating RN: Cornell Barman Primary Care Truett Mcfarlan: Ramonita Lab Other Clinician: Referring  Antolin: Ramonita Lab Treating Nonie Lochner/Extender: Tito Dine in Treatment: 3 Active Inactive Orientation to the Wound Care Program Nursing Diagnoses: Knowledge deficit related to the wound healing center program Goals: Patient/caregiver will verbalize understanding of the [place] Program Date Initiated: 03/02/2019 Target Resolution Date: 03/02/2019 Goal Status: Active Interventions: Provide education on orientation to the wound center Notes: Soft Tissue Infection Nursing Diagnoses: Impaired tissue integrity Potential for infection: soft tissue Goals: Patient/caregiver will verbalize understanding of or measures to prevent infection and contamination in the home setting Date Initiated: 03/02/2019 Target Resolution Date: 03/02/2019 Goal Status: Active Patient's soft tissue infection will resolve Date Initiated: 03/02/2019 Target Resolution Date: 03/09/2019 Goal Status: Active Signs and symptoms of infection will be recognized early to allow for prompt treatment Date Initiated: 03/02/2019 Target Resolution Date: 03/09/2019 Goal Status: Active Interventions: Assess signs and symptoms of infection every visit Treatment Activities: Systemic antibiotics : 03/02/2019 Notes: Wound/Skin Impairment Reta, Kyzer ([id number]) Nursing Diagnoses: Impaired tissue integrity Knowledge deficit related to ulceration/compromised skin integrity Goals: Patient/caregiver will verbalize understanding of skin care regimen Date Initiated: 03/02/2019 Target Resolution Date: 03/02/2019 Goal Status: Active Ulcer/skin breakdown will have a volume reduction of 30% by week 4 Date Initiated: 03/02/2019 Target Resolution Date: 03/30/2019 Goal Status: Active Interventions: Assess ulceration(s) every visit Provide education on ulcer  and skin care Treatment Activities: Skin care regimen initiated : 03/02/2019 Topical wound management initiated : 03/02/2019 Notes: Electronic Signature(s) Signed: 03/23/2019 5:54:22 PM By: Gretta Cool, BSN, RN, CWS, Kim RN, BSN Entered By: Gretta Cool, BSN, RN, CWS, Kim on 03/23/2019 15:03:24 Willingham, Dragan ([id number]) -------------------------------------------------------------------------------- Pain Assessment Details Patient Name: [name] Walker, [name] Walker Date of Service: 03/23/2019 2:45 PM Medical Record Number: [id number] Patient Account Number: [id number] Date of Birth/Sex: 1976/05/09 (58 [name] Walker.o. M) Treating RN: Army Melia Primary Care Jaidalyn Schillo: Ramonita Lab Other Clinician: Referring Anthon Harpole: Ramonita Lab Treating Morine Kohlman/Extender: Ricard Dillon Weeks in Treatment: 3 Active Problems Location of Pain Severity and Description of Pain Patient Has Paino No Site Locations Pain Management and Medication Current Pain Management: Electronic Signature(s) Signed: 03/24/2019 2:48:20 PM By: Army Melia Entered By: Army Melia on 03/23/2019 14:41:03 [place], Konrad ([id number]) -------------------------------------------------------------------------------- Patient/Caregiver Education Details Patient Name: [name] Walker, [name] Walker Date of Service: 03/23/2019 2:45 PM Medical Record Number: [id number] Patient Account Number: [id number] Date of Birth/Gender: [birth year] (37 [name] Walker.o. M) Treating RN: Cornell Barman Primary Care  Physician: Daniel NonesKLEIN, BERT Other Clinician: Referring Physician: Daniel NonesKLEIN, BERT Treating Physician/Extender: Altamese CarolinaOBSON, MICHAEL G Weeks in Treatment: 3 Education Assessment Education Provided To: Patient Education Topics Provided Venous: Handouts: Controlling Swelling with Multilayered Compression Wraps Methods: Demonstration, Explain/Verbal Responses: State content correctly Electronic Signature(s) Signed: 03/23/2019 5:54:22 PM By: Elliot GurneyWoody, BSN, RN, CWS, Kim RN, BSN Entered By: Elliot GurneyWoody, BSN, RN, CWS, Kim on 03/23/2019  15:10:32 Smead, Labib ([id number]) -------------------------------------------------------------------------------- Wound Assessment Details Patient Name: [name] Walker, [name] Walker Date of Service: 03/23/2019 2:45 PM Medical Record Number: [id number] Patient Account Number: [id number] Date of Birth/Sex: 20-Sep-1975 (43 [name] Walker.o. M) Treating RN: Rodell PernaScott, Dajea Primary Care Keeon Zurn: Daniel NonesKLEIN, BERT Other Clinician: Referring Summar Mcglothlin: Daniel NonesKLEIN, BERT Treating Bolden Hagerman/Extender: Maxwell CaulOBSON, MICHAEL G Weeks in Treatment: 3 Wound Status Wound Number: 1 Primary Etiology: Lymphedema Wound Location: Right Lower Leg - Posterior Secondary Etiology: Venous Leg Ulcer Wounding Event: Gradually Appeared Wound Status: Open Date Acquired: 02/24/2019 Comorbid History: Hypertension Weeks Of Treatment: 3 Clustered Wound: No Photos Wound Measurements Length: (cm) 16 Width: (cm) 15 Depth: (cm) 0.1 Area: (cm) 188.496 Volume: (cm) 18.85 % Reduction in Area: 47.4% % Reduction in Volume: 47.4% Epithelialization: None Tunneling: No Undermining: No Wound Description Full Thickness Without Exposed Support Classification: Structures Wound Margin: Flat and Intact Exudate Large Amount: Exudate Type: Serosanguineous Exudate Color: red, brown Foul Odor After Cleansing: No Slough/Fibrino Yes Wound Bed Granulation Amount: Medium (34-66%) Exposed Structure Granulation Quality: Pale Fascia Exposed: No Necrotic Amount: Medium (34-66%) Fat Layer (Subcutaneous Tissue) Exposed: Yes Necrotic Quality: Adherent Slough Tendon Exposed: No Muscle Exposed: No Joint Exposed: No Bone Exposed: No Brougher, Jasn ([id number]) Treatment Notes Wound #1 (Right, Posterior Lower Leg) Notes TCA, silvercel, abd, 4 layer wrap with unna to anchor Electronic Signature(s) Signed: 03/24/2019 2:48:20 PM By: Rodell PernaScott, Dajea Entered By: Rodell PernaScott, Dajea on 03/23/2019 14:50:42 [name] Walker, Yadiel  ([id number]) -------------------------------------------------------------------------------- Vitals Details Patient Name: [name] Walker, [name] Walker Date of Service: 03/23/2019 2:45 PM Medical Record Number: [id number] Patient Account Number: [id number] Date of Birth/Sex: 20-Sep-1975 (43 [name] Walker.o. M) Treating RN: Rodell PernaScott, Dajea Primary Care [place]: Daniel NonesKLEIN, BERT Other Clinician: Referring Roselle Norton: Daniel NonesKLEIN, BERT Treating Christiano Blandon/Extender: Altamese CarolinaOBSON, MICHAEL G Weeks in Treatment: 3 Vital Signs Time Taken: 14:41 Temperature (F): 98.7 Height (in): 66 Pulse (bpm): 98 Weight (lbs): 442 Respiratory Rate (breaths/min): 16 Body Mass Index (BMI): 71.3 Blood Pressure (mmHg): 162/84 Reference Range: 80 - 120 mg / dl Electronic Signature(s) Signed: 03/24/2019 2:48:20 PM By: Rodell PernaScott, Dajea Entered By: Rodell PernaScott, Dajea on 03/23/2019 14:41:21

## 2019-03-25 ENCOUNTER — Telehealth (INDEPENDENT_AMBULATORY_CARE_PROVIDER_SITE_OTHER): Payer: Self-pay

## 2019-03-25 NOTE — Telephone Encounter (Signed)
Hi Merwyn Katos all is well. I wanted to let you know that the insurance for patient MRN [id number] is inactive. We have called him several times, but with no answer. We'll keep trying. Just wanted to make you aware. Thanks!  [place]   This in regards of the patient getting a Lymphedema pump at the doctors recommendation.

## 2019-03-30 ENCOUNTER — Encounter: Payer: BC Managed Care – PPO | Admitting: Internal Medicine

## 2019-03-30 ENCOUNTER — Other Ambulatory Visit: Payer: Self-pay

## 2019-03-30 DIAGNOSIS — L03115 Cellulitis of right lower limb: Secondary | ICD-10-CM | POA: Diagnosis not present

## 2019-03-30 DIAGNOSIS — S81801A Unspecified open wound, right lower leg, initial encounter: Secondary | ICD-10-CM | POA: Diagnosis not present

## 2019-03-30 DIAGNOSIS — I890 Lymphedema, not elsewhere classified: Secondary | ICD-10-CM | POA: Diagnosis not present

## 2019-03-30 DIAGNOSIS — L97211 Non-pressure chronic ulcer of right calf limited to breakdown of skin: Secondary | ICD-10-CM | POA: Diagnosis not present

## 2019-03-30 DIAGNOSIS — I87311 Chronic venous hypertension (idiopathic) with ulcer of right lower extremity: Secondary | ICD-10-CM | POA: Diagnosis not present

## 2019-03-30 NOTE — Progress Notes (Addendum)
[name] Walker, Vittorio ([id number]) Visit Report for 03/30/2019 Arrival Information Details Patient Name: [name] Walker, [name] Walker Date of Service: 03/30/2019 3:30 PM Medical Record Number: [id number] Patient Account Number: [id number] Date of Birth/Sex: 1976-07-27 (43 y.o. M) Treating RN: Rodell PernaScott, Dajea Primary Care Jamol Ginyard: Daniel NonesKLEIN, BERT Other Clinician: Referring Neamiah Sciarra: Daniel NonesKLEIN, BERT Treating Tranae Laramie/Extender: Altamese CarolinaOBSON, MICHAEL G Weeks in Treatment: 4 Visit Information History Since Last Visit Added or deleted any medications: No Patient Arrived: Ambulatory Any new allergies or adverse reactions: No Arrival Time: 15:17 Had a fall or experienced change in No Accompanied By: self activities of daily living that may affect Transfer Assistance: None risk of falls: Patient Has Alerts: Yes Signs or symptoms of abuse/neglect since last visito No Patient Alerts: Non-compressible Hospitalized since last visit: No Has Dressing in Place as Prescribed: Yes Pain Present Now: No Electronic Signature(s) Signed: 03/30/2019 3:27:20 PM By: Rodell PernaScott, Dajea Entered By: Rodell PernaScott, Dajea on 03/30/2019 15:17:58 Koenen, Raef ([id number]) -------------------------------------------------------------------------------- Encounter Discharge Information Details Patient Name: [name] Walker, [name] Walker Date of Service: 03/30/2019 3:30 PM Medical Record Number: [id number] Patient Account Number: [id number] Date of Birth/Sex: 1976-07-27 (43 y.o. M) Treating RN: Rodell PernaScott, Dajea Primary Care Tyisha Cressy: Daniel NonesKLEIN, BERT Other Clinician: Referring Emie Sommerfeld: Daniel NonesKLEIN, BERT Treating Kinga Cassar/Extender: Altamese CarolinaOBSON, MICHAEL G Weeks in Treatment: 4 Encounter Discharge Information Items Discharge Condition: Stable Ambulatory Status: Ambulatory Discharge Destination: Home Transportation: Private Auto Accompanied By: self Schedule Follow-up Appointment: Yes Clinical Summary of Care: Electronic Signature(s) Signed: 04/01/2019 11:37:09 AM By: Rodell PernaScott, Dajea Entered By: Rodell PernaScott,  Dajea on 03/30/2019 15:51:51 Westry, Yon ([id number]) -------------------------------------------------------------------------------- Lower Extremity Assessment Details Patient Name: [name] Walker, [name] Date of Service: 03/30/2019 3:30 PM Medical Record Number: [id number] Patient Account Number: [id number] Date of Birth/Sex: 1976-07-27 (43 y.o. M) Treating RN: Rodell PernaScott, Dajea Primary Care Tamatha Gadbois: Daniel NonesKLEIN, BERT Other Clinician: Referring Anniya Whiters: Daniel NonesKLEIN, BERT Treating Shauntelle Jamerson/Extender: Maxwell CaulOBSON, MICHAEL G Weeks in Treatment: 4 Edema Assessment Assessed: [Left: No] [Right: No] Edema: [Left: N] [Right: o] Calf Left: Right: Point of Measurement: 30 cm From Medial Instep cm 48 cm Ankle Left: Right: Point of Measurement: 10 cm From Medial Instep cm 30 cm Vascular Assessment Pulses: Dorsalis Pedis Palpable: [Right:Yes] Electronic Signature(s) Signed: 03/30/2019 3:27:20 PM By: Rodell PernaScott, Dajea Entered By: Rodell PernaScott, Dajea on 03/30/2019 15:22:50 Deam, Wilmont ([id number]) -------------------------------------------------------------------------------- Multi Wound Chart Details Patient Name: [name] Walker, [name] Walker Date of Service: 03/30/2019 3:30 PM Medical Record Number: [id number] Patient Account Number: [id number] Date of Birth/Sex: 1976-07-27 (43 y.o. M) Treating RN: Rodell PernaScott, Dajea Primary Care Shannie Kontos: Daniel NonesKLEIN, BERT Other Clinician: Referring Doha Boling: Daniel NonesKLEIN, BERT Treating Shuaib Corsino/Extender: Altamese CarolinaOBSON, MICHAEL G Weeks in Treatment: 4 Vital Signs Height(in): 66 Pulse(bpm): 90 Weight(lbs): 442 Blood Pressure(mmHg): 156/69 Body Mass Index(BMI): 71 Temperature(F): 98.6 Respiratory Rate 16 (breaths/min): Photos: [N/A:N/A] Wound Location: Right Lower Leg - Posterior N/A N/A Wounding Event: Gradually Appeared N/A N/A Primary Etiology: Lymphedema N/A N/A Secondary Etiology: Venous Leg Ulcer N/A N/A Comorbid History: Hypertension N/A N/A Date Acquired: 02/24/2019 N/A N/A Weeks of Treatment: 4 N/A N/A Wound  Status: Open N/A N/A Measurements L x W x D 7x8x0.1 N/A N/A (cm) Area (cm) : 43.982 N/A N/A Volume (cm) : 4.398 N/A N/A % Reduction in Area: 87.70% N/A N/A % Reduction in Volume: 87.70% N/A N/A Classification: Full Thickness Without N/A N/A Exposed Support Structures Exudate Amount: Medium N/A N/A Exudate Type: Serosanguineous N/A N/A Exudate Color: red, brown N/A N/A Wound Margin: Flat and Intact N/A N/A Granulation Amount: Medium (34-66%) N/A N/A Granulation Quality: Red, Pale N/A N/A Necrotic Amount: Medium (34-66%) N/A N/A Exposed Structures: Fat Layer (Subcutaneous N/A N/A Tissue)  Exposed: Yes Fascia: No Tendon: No Muscle: No Agrusa, Meldon ([id number]) Joint: No Bone: No Epithelialization: None N/A N/A Treatment Notes Wound #1 (Right, Posterior Lower Leg) Notes TCA, abd, 4 layer wrap with unna to anchor Electronic Signature(s) Signed: 03/30/2019 4:31:45 PM By: Baltazar Najjarobson, Michael MD Entered By: Baltazar Najjarobson, Michael on 03/30/2019 16:10:02 Giza, Fabion ([id number]) -------------------------------------------------------------------------------- Multi-Disciplinary Care Plan Details Patient Name: [name] Walker, [name] Date of Service: 03/30/2019 3:30 PM Medical Record Number: [id number] Patient Account Number: [id number] Date of Birth/Sex: March 22, 1976 (43 y.o. M) Treating RN: Rodell PernaScott, Dajea Primary Care Reeder Brisby: Daniel NonesKLEIN, BERT Other Clinician: Referring Amandeep Nesmith: Daniel NonesKLEIN, BERT Treating Katrine Radich/Extender: Altamese CarolinaOBSON, MICHAEL G Weeks in Treatment: 4 Active Inactive Orientation to the Wound Care Program Nursing Diagnoses: Knowledge deficit related to the wound healing center program Goals: Patient/caregiver will verbalize understanding of the Wound Healing Center Program Date Initiated: 03/02/2019 Target Resolution Date: 03/02/2019 Goal Status: Active Interventions: Provide education on orientation to the wound center Notes: Soft Tissue Infection Nursing Diagnoses: Impaired tissue  integrity Potential for infection: soft tissue Goals: Patient/caregiver will verbalize understanding of or measures to prevent infection and contamination in the home setting Date Initiated: 03/02/2019 Target Resolution Date: 03/02/2019 Goal Status: Active Patient's soft tissue infection will resolve Date Initiated: 03/02/2019 Target Resolution Date: 03/09/2019 Goal Status: Active Signs and symptoms of infection will be recognized early to allow for prompt treatment Date Initiated: 03/02/2019 Target Resolution Date: 03/09/2019 Goal Status: Active Interventions: Assess signs and symptoms of infection every visit Treatment Activities: Systemic antibiotics : 03/02/2019 Notes: Wound/Skin Impairment Lynne, Orie ([id number]) Nursing Diagnoses: Impaired tissue integrity Knowledge deficit related to ulceration/compromised skin integrity Goals: Patient/caregiver will verbalize understanding of skin care regimen Date Initiated: 03/02/2019 Target Resolution Date: 03/02/2019 Goal Status: Active Ulcer/skin breakdown will have a volume reduction of 30% by week 4 Date Initiated: 03/02/2019 Target Resolution Date: 03/30/2019 Goal Status: Active Interventions: Assess ulceration(s) every visit Provide education on ulcer and skin care Treatment Activities: Skin care regimen initiated : 03/02/2019 Topical wound management initiated : 03/02/2019 Notes: Electronic Signature(s) Signed: 04/01/2019 11:37:09 AM By: Rodell PernaScott, Dajea Entered By: Rodell PernaScott, Dajea on 03/30/2019 15:33:59 Bilyk, Abdo ([id number]) -------------------------------------------------------------------------------- Pain Assessment Details Patient Name: [name] Walker, Theador Date of Service: 03/30/2019 3:30 PM Medical Record Number: [id number] Patient Account Number: [id number] Date of Birth/Sex: March 22, 1976 (43 y.o. M) Treating RN: Rodell PernaScott, Dajea Primary Care Tyre Beaver: Daniel NonesKLEIN, BERT Other Clinician: Referring Davene Jobin: Daniel NonesKLEIN, BERT Treating  Quasean Frye/Extender: Maxwell CaulOBSON, MICHAEL G Weeks in Treatment: 4 Active Problems Location of Pain Severity and Description of Pain Patient Has Paino No Site Locations Pain Management and Medication Current Pain Management: Electronic Signature(s) Signed: 03/30/2019 3:27:20 PM By: Rodell PernaScott, Dajea Entered By: Rodell PernaScott, Dajea on 03/30/2019 15:18:03 Younkin, Peniel ([id number]) -------------------------------------------------------------------------------- Patient/Caregiver Education Details Patient Name: [name] Walker, Grayer Date of Service: 03/30/2019 3:30 PM Medical Record Number: [id number] Patient Account Number: [id number] Date of Birth/Gender: March 22, 1976 (43 y.o. M) Treating RN: Rodell PernaScott, Dajea Primary Care Physician: Daniel NonesKLEIN, BERT Other Clinician: Referring Physician: Daniel NonesKLEIN, BERT Treating Physician/Extender: Altamese CarolinaOBSON, MICHAEL G Weeks in Treatment: 4 Education Assessment Education Provided To: Patient Education Topics Provided Venous: Handouts: Controlling Swelling with Multilayered Compression Wraps Methods: Demonstration, Explain/Verbal Responses: State content correctly Electronic Signature(s) Signed: 04/01/2019 11:37:09 AM By: Rodell PernaScott, Dajea Entered By: Rodell PernaScott, Dajea on 03/30/2019 15:37:49 Llorens, Ahyan ([id number]) -------------------------------------------------------------------------------- Wound Assessment Details Patient Name: [name] Walker, Zyrus Date of Service: 03/30/2019 3:30 PM Medical Record Number: [id number] Patient Account Number: [id number] Date of Birth/Sex: March 22, 1976 (43 y.o. M) Treating RN: Rodell PernaScott, Dajea Primary Care Jared Whorley: Daniel NonesKLEIN, BERT Other Clinician: Referring Ashaz Robling: Daniel NonesKLEIN, BERT Treating Alleyne Lac/Extender:  ROBSON, MICHAEL G Weeks in Treatment: 4 Wound Status Wound Number: 1 Primary Etiology: Lymphedema Wound Location: Right Lower Leg - Posterior Secondary Etiology: Venous Leg Ulcer Wounding Event: Gradually Appeared Wound Status: Open Date Acquired: 02/24/2019 Comorbid History:  Hypertension Weeks Of Treatment: 4 Clustered Wound: No Photos Wound Measurements Length: (cm) 7 Width: (cm) 8 Depth: (cm) 0.1 Area: (cm) 43.982 Volume: (cm) 4.398 % Reduction in Area: 87.7% % Reduction in Volume: 87.7% Epithelialization: None Tunneling: No Undermining: No Wound Description Full Thickness Without Exposed Support Classification: Structures Wound Margin: Flat and Intact Exudate Medium Amount: Exudate Type: Serosanguineous Exudate Color: red, brown Foul Odor After Cleansing: No Slough/Fibrino Yes Wound Bed Granulation Amount: Medium (34-66%) Exposed Structure Granulation Quality: Red, Pale Fascia Exposed: No Necrotic Amount: Medium (34-66%) Fat Layer (Subcutaneous Tissue) Exposed: Yes Necrotic Quality: Adherent Slough Tendon Exposed: No Muscle Exposed: No Joint Exposed: No Bone Exposed: No Docken, Fadi ([id number]) Treatment Notes Wound #1 (Right, Posterior Lower Leg) Notes TCA, abd, 4 layer wrap with unna to anchor Electronic Signature(s) Signed: 03/30/2019 3:27:20 PM By: Army Melia Entered By: Army Melia on 03/30/2019 15:21:37 Dittmer, Semir ([id number]) -------------------------------------------------------------------------------- Vitals Details Patient Name: [name], Willow Date of Service: 03/30/2019 3:30 PM Medical Record Number: [id number] Patient Account Number: [id number] Date of Birth/Sex: 1976/01/25 (43 y.o. M) Treating RN: Army Melia Primary Care Oceanna Arruda: Ramonita Lab Other Clinician: Referring Kewan Mcnease: Ramonita Lab Treating Boluwatife Flight/Extender: Tito Dine in Treatment: 4 Vital Signs Time Taken: 15:18 Temperature (F): 98.6 Height (in): 66 Pulse (bpm): 90 Weight (lbs): 442 Respiratory Rate (breaths/min): 16 Body Mass Index (BMI): 71.3 Blood Pressure (mmHg): 156/69 Reference Range: 80 - 120 mg / dl Electronic Signature(s) Signed: 03/30/2019 3:27:20 PM By: Army Melia Entered By: Army Melia on 03/30/2019  15:18:45

## 2019-04-01 DIAGNOSIS — D649 Anemia, unspecified: Secondary | ICD-10-CM | POA: Diagnosis not present

## 2019-04-01 NOTE — Progress Notes (Signed)
[name] RepressSHAFFER, Tylyn ([id number]) Visit Report for 03/30/2019 HPI Details Patient Name: [name] Walker, [name] Date of Service: 03/30/2019 3:30 PM Medical Record Number: [id number] Patient Account Number: [id number] Date of Birth/Sex: [birth year] (43 y.o. M) Treating RN: Huel CoventryWoody, Kim Primary Care Provider: Daniel NonesKLEIN, BERT Other Clinician: Referring Provider: Daniel NonesKLEIN, BERT Treating Provider/Extender: Altamese CarolinaOBSON, Raisa Ditto G Weeks in Treatment: 4 History of Present Illness HPI Description: ADMISSION 03/02/2019 This is a 43 year old man who is not a diabetic. He states he was well up until 8 days ago when he felt fatigued and generally unwell while at work. He went home and discovered intense erythema in the right leg. This was painful. He did not make it into see his doctor until 3 days later. He was put on doxycycline. He had a duplex ultrasound done at interventional radiology that did not show reflux, DVT or superficial thrombophlebitis. He was put on doxycycline which she is still taking. He was put in an Radio broadcast assistantUnna boot. He was reviewed again 2 days ago and referred here. He had an Radio broadcast assistantUnna boot on when he came in here today. Patient states he is not systemically unwell but the leg is uncomfortable. He does not have a history of wounds on his legs. He does have some discoloration of the left leg which looks like chronic venous changes however he states that the right leg did not have this prior to any changes recently. I note that he has been seen by Dr. Graciela HusbandsKlein at the [facility] worked [facility] for lower extremity edema. He had an echocardiogram. Past medical history includes hypertension, edema of both legs, obstructive sleep apnea, morbid obesity. Allergies, he is listed as being allergic to amoxicillin from 2015 and his primary doctor's office although I do not see this in epic. The patient does not remember anything about this. He does not remember being on amoxicillin. Socially; he works in a Education officer, communitysewage and water treatment. But he  denies getting his leg into contact with anything particularly problematic ABIs in our clinic were noncompressible bilaterally 03/04/19 upon evaluation today patient has come in for reevaluation due to the fact that he had a significant right lower extremity cellulitis when he was seen in the office on Wednesday, two days ago. Subsequently he did not want to go to the ER for likely admission and I'll be anabiotic therapy. For that reason we actually have placed him when he saw Dr. Leanord Hawkingobson Wednesday on linezolid which he has been taking since that time. Subsequently the culture come back showing gram- negative rods and therefore Cipro was also recommended by Dr. Leanord Hawkingobson although that has not been seen in yet that was just this morning. The final culture and sensitivity has not returned yet. No fevers, chills, nausea, or vomiting noted at this time. As far as the markings around the leg actually appears that their theme a has receded somewhat compared to what it showed during the evaluation today is ago and he also states that is not as tender to touch which is also good news. 6/24; the culture I did of what I thought was a blister in the lower part of this wound grew gram-negative's. This was Pseudomonas and Serratia. In response to that we put him on ciprofloxacin which will end in 2 days. He also had Lunesta lid which fortunately did not cost him too much. His leg is a lot better. There is no tenderness. He has some erythema continuing but this is not tender. He was referred to Dr. dew at [facility]  by his primary doctor. They felt that he had lymphedema and that lymphedema pumps would benefit. They are ordering a reflux study that should be done tomorrow. He has remained systemically well. Overall his cellulitis was extensive things look a lot better today than a week ago 7/1; continued copious amounts of adherent necrotic debris over the wound surface. This required an extensive  debridement with a #5 curette today. We also vigorously washed this area off with Vashe solution. He will need to come back for a nurse change. This is likely secondary to extensive bioburden over the wound area. We are using 3 layer compression he appears to tolerate this fairly well. The patient had reflux studies done. This showed no evidence of deep venous thrombosis in the right. Part of the exam was limited there was no evidence of superficial vein thrombosis and no evidence of chronic venous insufficiency. [place]ham, Yasir ([id number]) 7/8; most of the patient's wound had actually begin to epithelialize. He still had probably about 30% the required debridement. using silver alginate 7/15; the patient took the wrap off because of itching. The skin on his posterior calf is irritated but I do not think infected in fact there is no open wound here. This may be the Fash solution we have been using I lost track that he was using this. We gave him his measurements for stockings he is trying to procure that Electronic Signature(s) Signed: 03/30/2019 4:31:45 PM By: Linton Ham MD Entered By: Linton Ham on 03/30/2019 16:22:51 [place], Markeem ([id number]) -------------------------------------------------------------------------------- Physical Exam Details Patient Name: [name] Walker Date of Service: 03/30/2019 3:30 PM Medical Record Number: [id number] Patient Account Number: [id number] Date of Birth/Sex: 06/24/1976 (43 y.o. M) Treating RN: Cornell Barman Primary Care Provider: Ramonita Lab Other Clinician: Referring Provider: Ramonita Lab Treating Provider/Extender: Tito Dine in Treatment: 4 Constitutional Patient is hypertensive.. Pulse regular and within target range for patient.Marland Kitchen Respirations regular, non-labored and within target range.. Temperature is normal and within the target range for the patient.Marland Kitchen appears in no distress. Respiratory Respiratory effort is easy and symmetric  bilaterally. Rate is normal at rest and on room air.. Cardiovascular Pedal pulses intact.. Integumentary (Hair, Skin) Skin changes of chronic venous hypertension on the left. He has erythema on the posterior calf but no tenderness. This may be contact dermatitis from the Vashe solution or chronic stasis dermatitis. Psychiatric No evidence of depression, anxiety, or agitation. Calm, cooperative, and communicative. Appropriate interactions and affect.. Notes Wound exam; patient's lower extremity has some edema. There is significant erythema which may be contact dermatitis or stasis dermatitis. Everything is epithelialized. There is no open wound. No debridement is necessary. Electronic Signature(s) Signed: 03/30/2019 4:31:45 PM By: Linton Ham MD Entered By: Linton Ham on 03/30/2019 16:24:47 [place], Reza ([id number]) -------------------------------------------------------------------------------- Physician Orders Details Patient Name: [name] Lamp, Shana Date of Service: 03/30/2019 3:30 PM Medical Record Number: [id number] Patient Account Number: [id number] Date of Birth/Sex: May 16, 1976 (43 y.o. M) Treating RN: Army Melia Primary Care Provider: Ramonita Lab Other Clinician: Referring Provider: Ramonita Lab Treating Provider/Extender: Tito Dine in Treatment: 4 Verbal / Phone Orders: No Diagnosis Coding Wound Cleansing Wound #1 Right,Posterior Lower Leg o Dial antibacterial soap, wash wounds, rinse and pat dry prior to dressing wounds o May shower with protection. o Other: - Wash with Vashe at dressing change. Skin Barriers/Peri-Wound Care Wound #1 Right,Posterior Lower Leg o Triamcinolone Acetonide Ointment (TCA) Primary Wound Dressing Wound #1 Right,Posterior Lower Leg o ABD Pad Dressing Change Frequency Wound #1 Right,Posterior Lower  Leg o Other: - Monday Follow-up Appointments Wound #1 Right,Posterior Lower Leg o Return Appointment in 1 week. o  Nurse Visit as needed Edema Control Wound #1 Right,Posterior Lower Leg o 4-Layer Compression System - Right Lower Extremity o Elevate legs to the level of the heart and pump ankles as often as possible Additional Orders / Instructions Wound #1 Right,Posterior Lower Leg o OK to return to work Psychologist, prison and probation services) Signed: 03/30/2019 4:31:45 PM By: Baltazar Najjar MD Signed: 04/01/2019 11:37:09 AM By: Rodell Perna Entered By: Rodell Perna on 03/30/2019 15:36:57 Peale, Jamarius ([id number]) -------------------------------------------------------------------------------- Problem List Details Patient Name: [name] Walker, Lorris Date of Service: 03/30/2019 3:30 PM Medical Record Number: [id number] Patient Account Number: [id number] Date of Birth/Sex: 04/06/1976 (43 y.o. M) Treating RN: Huel Coventry Primary Care Provider: Daniel Nones Other Clinician: Referring Provider: Daniel Nones Treating Provider/Extender: Altamese [place] in Treatment: 4 Active Problems ICD-10 Evaluated Encounter Code Description Active Date Today Diagnosis L97.211 Non-pressure chronic ulcer of right calf limited to breakdown 03/02/2019 No Yes of skin L03.115 Cellulitis of right lower limb 03/02/2019 No Yes I87.311 Chronic venous hypertension (idiopathic) with ulcer of right 03/02/2019 No Yes lower extremity I89.0 Lymphedema, not elsewhere classified 03/09/2019 No Yes Inactive Problems Resolved Problems Electronic Signature(s) Signed: 03/30/2019 4:31:45 PM By: Baltazar Najjar MD Entered By: Baltazar Najjar on 03/30/2019 16:09:53 Tencza, Aeneas ([id number]) -------------------------------------------------------------------------------- Progress Note Details Patient Name: [name] Walker, Kevin Date of Service: 03/30/2019 3:30 PM Medical Record Number: [id number] Patient Account Number: [id number] Date of Birth/Sex: 14-Mar-1976 (43 y.o. M) Treating RN: Huel Coventry Primary Care Provider: Daniel Nones Other Clinician: Referring  Provider: Daniel Nones Treating Provider/Extender: Maxwell Caul Weeks in Treatment: 4 Subjective History of Present Illness (HPI) ADMISSION 03/02/2019 This is a 43 year old man who is not a diabetic. He states he was well up until 8 days ago when he felt fatigued and generally unwell while at work. He went home and discovered intense erythema in the right leg. This was painful. He did not make it into see his doctor until 3 days later. He was put on doxycycline. He had a duplex ultrasound done at interventional radiology that did not show reflux, DVT or superficial thrombophlebitis. He was put on doxycycline which she is still taking. He was put in an Radio broadcast assistant. He was reviewed again 2 days ago and referred here. He had an Radio broadcast assistant on when he came in here today. Patient states he is not systemically unwell but the leg is uncomfortable. He does not have a history of wounds on his legs. He does have some discoloration of the left leg which looks like chronic venous changes however he states that the right leg did not have this prior to any changes recently. I note that he has been seen by Dr. Graciela Husbands at the [facility] worked up for lower extremity edema. He had an echocardiogram. Past medical history includes hypertension, edema of both legs, obstructive sleep apnea, morbid obesity. Allergies, he is listed as being allergic to amoxicillin from 2015 and his primary doctor's office although I do not see this in epic. The patient does not remember anything about this. He does not remember being on amoxicillin. Socially; he works in a Education officer, community. But he denies getting his leg into contact with anything particularly problematic ABIs in our clinic were noncompressible bilaterally 03/04/19 upon evaluation today patient has come in for reevaluation due to the fact that he had a significant right lower extremity cellulitis when he was seen in the  office on Wednesday, two days ago.  Subsequently he did not want to go to the ER for likely admission and I'll be anabiotic therapy. For that reason we actually have placed him when he saw Dr. Leanord Hawkingobson Wednesday on linezolid which he has been taking since that time. Subsequently the culture come back showing gram- negative rods and therefore Cipro was also recommended by Dr. Leanord Hawkingobson although that has not been seen in yet that was just this morning. The final culture and sensitivity has not returned yet. No fevers, chills, nausea, or vomiting noted at this time. As far as the markings around the leg actually appears that their theme a has receded somewhat compared to what it showed during the evaluation today is ago and he also states that is not as tender to touch which is also good news. 6/24; the culture I did of what I thought was a blister in the lower part of this wound grew gram-negative's. This was Pseudomonas and Serratia. In response to that we put him on ciprofloxacin which will end in 2 days. He also had Lunesta lid which fortunately did not cost him too much. His leg is a lot better. There is no tenderness. He has some erythema continuing but this is not tender. He was referred to Dr. dew at [facility] vascular by his primary doctor. They felt that he had lymphedema and that lymphedema pumps would benefit. They are ordering a reflux study that should be done tomorrow. He has remained systemically well. Overall his cellulitis was extensive things look a lot better today than a week ago 7/1; continued copious amounts of adherent necrotic debris over the wound surface. This required an extensive debridement with a #5 curette today. We also vigorously washed this area off with Vashe solution. He will need to come back for a nurse change. This is likely secondary to extensive bioburden over the wound area. We are using 3 layer compression he appears to tolerate this fairly well. The patient had reflux studies done. This showed no  evidence of deep venous thrombosis in the right. Part of the exam was limited there was no evidence of superficial vein thrombosis and no evidence of chronic venous insufficiency. 7/8; most of the patient's wound had actually begin to epithelialize. He still had probably about 30% the required debridement. using silver alginate 7/15; the patient took the wrap off because of itching. The skin on his posterior calf is irritated but I do not think infected in fact [place], Jabar ([id number]) there is no open wound here. This may be the Fash solution we have been using I lost track that he was using this. We gave him his measurements for stockings he is trying to procure that Objective Constitutional Patient is hypertensive.. Pulse regular and within target range for patient.Marland Kitchen. Respirations regular, non-labored and within target range.. Temperature is normal and within the target range for the patient.Marland Kitchen. appears in no distress. Vitals Time Taken: 3:18 PM, Height: 66 in, Weight: 442 lbs, BMI: 71.3, Temperature: 98.6 F, Pulse: 90 bpm, Respiratory Rate: 16 breaths/min, Blood Pressure: 156/69 mmHg. Respiratory Respiratory effort is easy and symmetric bilaterally. Rate is normal at rest and on room air.. Cardiovascular Pedal pulses intact.Marland Kitchen. Psychiatric No evidence of depression, anxiety, or agitation. Calm, cooperative, and communicative. Appropriate interactions and affect.. General Notes: Wound exam; patient's lower extremity has some edema. There is significant erythema which may be contact dermatitis or stasis dermatitis. Everything is epithelialized. There is no open wound. No debridement is  necessary. Integumentary (Hair, Skin) Skin changes of chronic venous hypertension on the left. He has erythema on the posterior calf but no tenderness. This may be contact dermatitis from the Vashe solution or chronic stasis dermatitis. Wound #1 status is Open. Original cause of wound was Gradually Appeared.  The wound is located on the Right,Posterior Lower Leg. The wound measures 7cm length x 8cm width x 0.1cm depth; 43.982cm^2 area and 4.398cm^3 volume. There is Fat Layer (Subcutaneous Tissue) Exposed exposed. There is no tunneling or undermining noted. There is a medium amount of serosanguineous drainage noted. The wound margin is flat and intact. There is medium (34-66%) red, pale granulation within the wound bed. There is a medium (34-66%) amount of necrotic tissue within the wound bed including Adherent Slough. Assessment Active Problems ICD-10 Non-pressure chronic ulcer of right calf limited to breakdown of skin Cellulitis of right lower limb Chronic venous hypertension (idiopathic) with ulcer of right lower extremity Lymphedema, not elsewhere classified Gotay, Miciah ([id number]) Plan Wound Cleansing: Wound #1 Right,Posterior Lower Leg: Dial antibacterial soap, wash wounds, rinse and pat dry prior to dressing wounds May shower with protection. Other: - Wash with Vashe at dressing change. Skin Barriers/Peri-Wound Care: Wound #1 Right,Posterior Lower Leg: Triamcinolone Acetonide Ointment (TCA) Primary Wound Dressing: Wound #1 Right,Posterior Lower Leg: ABD Pad Dressing Change Frequency: Wound #1 Right,Posterior Lower Leg: Other: - Monday Follow-up Appointments: Wound #1 Right,Posterior Lower Leg: Return Appointment in 1 week. Nurse Visit as needed Edema Control: Wound #1 Right,Posterior Lower Leg: 4-Layer Compression System - Right Lower Extremity Elevate legs to the level of the heart and pump ankles as often as possible Additional Orders / Instructions: Wound #1 Right,Posterior Lower Leg: OK to return to work 1. Liberal TCA 2. ABDs under 3 layer compression 3. I have asked him to get compression stockings I plan to discharge him next week 4. He originally had an intense soft tissue infection/cellulitis which I thought he needed to go to hospital for but he  refused. Fortunately we were able to manage this as an outpatient. Electronic Signature(s) Signed: 03/30/2019 4:31:45 PM By: Baltazar Najjarobson, Anniah Glick MD Entered By: Baltazar Najjarobson, Jermey Closs on 03/30/2019 16:25:39 Reppucci, Kevork ([id number]) -------------------------------------------------------------------------------- SuperBill Details Patient Name: [name] RepressSHAFFER, Corbin Date of Service: 03/30/2019 Medical Record Number: [id number] Patient Account Number: [id number] Date of Birth/Sex: 03-24-1976 (43 y.o. 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 of skin L03.115 Cellulitis of right lower limb I87.311 Chronic venous hypertension (idiopathic) with ulcer of right lower extremity I89.0 Lymphedema, not elsewhere classified Facility Procedures CPT4 Code Description: [id number] (Facility Use Only) [id number] - APPLY MULTLAY COMPRS LWR RT LEG Modifier: Quantity: 1 Physician Procedures CPT4 Code: [id number] Description: 99213 - WC PHYS LEVEL 3 - EST PT ICD-10 Diagnosis Description L97.211 Non-pressure chronic ulcer of right calf limited to breakdown I87.311 Chronic venous hypertension (idiopathic) with ulcer of right I89.0 Lymphedema, not elsewhere  classified L03.115 Cellulitis of right lower limb Modifier: of skin lower extremity Quantity: 1 Electronic Signature(s) Signed: 03/30/2019 4:31:45 PM By: Baltazar Najjarobson, Marshe Shrestha MD Entered By: Baltazar Najjarobson, Malayja Freund on 03/30/2019 16:26:05

## 2019-04-06 ENCOUNTER — Encounter: Payer: BC Managed Care – PPO | Admitting: Internal Medicine

## 2019-04-06 ENCOUNTER — Other Ambulatory Visit: Payer: Self-pay

## 2019-04-06 DIAGNOSIS — L97211 Non-pressure chronic ulcer of right calf limited to breakdown of skin: Secondary | ICD-10-CM | POA: Diagnosis not present

## 2019-04-06 DIAGNOSIS — L309 Dermatitis, unspecified: Secondary | ICD-10-CM | POA: Diagnosis not present

## 2019-04-06 DIAGNOSIS — I890 Lymphedema, not elsewhere classified: Secondary | ICD-10-CM | POA: Diagnosis not present

## 2019-04-06 DIAGNOSIS — S81801A Unspecified open wound, right lower leg, initial encounter: Secondary | ICD-10-CM | POA: Diagnosis not present

## 2019-04-06 DIAGNOSIS — L03115 Cellulitis of right lower limb: Secondary | ICD-10-CM | POA: Diagnosis not present

## 2019-04-06 DIAGNOSIS — I87311 Chronic venous hypertension (idiopathic) with ulcer of right lower extremity: Secondary | ICD-10-CM | POA: Diagnosis not present

## 2019-04-06 NOTE — Progress Notes (Signed)
[name] [name] ([id number]) Visit Report for 04/06/2019 HPI Details Patient Name: [name] [name] Date of Service: 04/06/2019 3:45 PM Medical Record Number: [id number] Patient Account Number: [id number] Date of Birth/Sex: October 25, 1975 (43 y.o. M) Treating RN: [name] [name] Primary Care Provider: Daniel NonesKLEIN, [name] Other Clinician: Referring Provider: Daniel NonesKLEIN, [name] Treating Provider/Extender: [name] [name] in Treatment: 5 History of Present Illness HPI Description: ADMISSION 03/02/2019 This is a 43 year old man who is not a diabetic. He states he was well up until 8 days ago when he felt fatigued and generally unwell while at work. He went home and discovered intense erythema in the right leg. This was painful. He did not make it into see his doctor until 3 days later. He was put on doxycycline. He had a duplex ultrasound done at [facility]al radi[facility] that did not show reflux, DVT or superficial thrombophlebitis. He was put on doxycycline which she is still taking. He was put in an Radio broadcast assistantUnna boot. He was reviewed again 2 days ago and referred here. He had an Radio broadcast assistantUnna boot on when he came in here today. Patient states he is not systemically unwell but the leg is uncomfortable. He does not have a history of wounds on his legs. He does have some discoloration of the left leg which looks like chronic venous changes however he states that the right leg did not have this prior to any changes recently. I note that he has been seen by [name] [name] at the [facility] worked up for lower extremity edema. He had an echocardiogram. Past medical history includes hypertension, edema of both legs, obstructive sleep apnea, morbid obesity. Allergies, he is listed as being allergic to amoxicillin from 2015 and his primary doctor's office although I do not see this in epic. The patient does not remember anything about this. He does not remember being on amoxicillin. Socially; he works in a Education officer, communitysewage and water treatment. But he  denies getting his leg into contact with anything particularly problematic ABIs in our clinic were noncompressible bilaterally 03/04/19 upon evaluation today patient has come in for reevaluation due to the fact that he had a significant right lower extremity cellulitis when he was seen in the office on Wednesday, two days ago. Subsequently he did not want to go to the ER for likely admission and I'll be anabiotic therapy. For that reason we actually have placed him when he saw [name] [name] Wednesday on linezolid which he has been taking since that time. Subsequently the culture come back showing gram- negative rods and therefore Cipro was also recommended by [name] [name] although that has not been seen in yet that was just this morning. The final culture and sensitivity has not returned yet. No fevers, chills, nausea, or vomiting noted at this time. As far as the markings around the leg actually appears that their theme a has receded somewhat compared to what it showed during the evaluation today is ago and he also states that is not as tender to touch which is also good news. 6/24; the culture I did of what I thought was a blister in the lower part of this wound grew gram-negative's. This was Pseudomonas and Serratia. In response to that we put him on ciprofloxacin which will end in 2 days. He also had Lunesta lid which fortunately did not cost him too much. His leg is a lot better. There is no tenderness. He has some erythema continuing but this is not tender. He was referred to Dr. dew at [facility]  by his primary doctor. They felt that he had lymphedema and that lymphedema pumps would benefit. They are ordering a reflux study that should be done tomorrow. He has remained systemically well. Overall his cellulitis was extensive things look a lot better today than a week ago 7/1; continued copious amounts of adherent necrotic debris over the wound surface. This required an extensive  debridement with a #5 curette today. We also vigorously washed this area off with Vashe solution. He will need to come back for a nurse change. This is likely secondary to extensive bioburden over the wound area. We are using 3 layer compression he appears to tolerate this fairly well. The patient had reflux studies done. This showed no evidence of deep venous thrombosis in the right. Part of the exam was limited there was no evidence of superficial vein thrombosis and no evidence of chronic venous insufficiency. [name] [name] ([id number]) 7/8; most of the patient's wound had actually begin to epithelialize. He still had probably about 30% the required debridement. using silver alginate 7/15; the patient took the wrap off because of itching. The skin on his posterior calf is irritated but I do not think infected in fact there is no open wound here. This may be the vash solution we have been using I lost track that he was using this. We gave him his measurements for stockings he is trying to procure that 7/22; the patient's right posterior calf wound is completely healed. He has changes of chronic stasis dermatitis. He has his compression stockings at home. The patient presented initially with an intense cellulitis with soft tissue destruction in the posterior part of his leg. He refused admission to hospital but fortunately responded to oral antibiotics as directed by this clinic. He has severe bilateral stasis dermatitis changes probably some degree of secondary lymphedema Electronic Signature(s) Signed: 04/06/2019 4:35:36 PM By: [name] Najjarobson, [name] Willner MD Entered By: [name] Najjarobson, Reverie Vaquera on 04/06/2019 16:27:25 [name] [name] ([id number]) -------------------------------------------------------------------------------- Physical Exam Details Patient Name: [name] RepressSHAFFER, [name] Date of Service: 04/06/2019 3:45 PM Medical Record Number: [id number] Patient Account Number: [id number] Date of Birth/Sex: 03-05-1976 (43 y.o.  M) Treating RN: [name] [name] Primary Care Provider: Daniel NonesKLEIN, [name] Other Clinician: Referring Provider: Daniel NonesKLEIN, [name] Treating Provider/Extender: [name] CarolinaOBSON, Moses Odoherty G [name] in Treatment: 5 Constitutional Patient is hypertensive.. Pulse regular and within target range for patient.Marland Kitchen. Respirations regular, non-labored and within target range.. Temperature is normal and within the target range for the patient.Marland Kitchen. appears in no distress. Cardiovascular Pedal pulses are palpable. Edema is reasonably well controlled. Integumentary (Hair, Skin) He has chronic stasis dermatitis right greater than left some degree of secondary lymphedema bilaterally. There is no open wound. Psychiatric No evidence of depression, anxiety, or agitation. Calm, cooperative, and communicative. Appropriate interactions and affect.. Notes Wound exam; the patient's right lower extremity has nonpitting edema. There is significant skin damage in his right lower extremity with very fibrosed lower extremity skin. However everything is epithelialized here. There is no open wound Electronic Signature(s) Signed: 04/06/2019 4:35:36 PM By: [name] Najjarobson, Anna-Marie Coller MD Entered By: [name] Najjarobson, Latysha Thackston on 04/06/2019 16:29:33 Stephanie, Redell ([id number]) -------------------------------------------------------------------------------- Physician Orders Details Patient Name: [name] RepressSHAFFER, [name] Date of Service: 04/06/2019 3:45 PM Medical Record Number: [id number] Patient Account Number: [id number] Date of Birth/Sex: 03-05-1976 (43 y.o. M) Treating RN: [name] [name] Primary Care Provider: Daniel NonesKLEIN, [name] Other Clinician: Referring Provider: Daniel NonesKLEIN, [name] Treating Provider/Extender: [name] CarolinaOBSON, Anthony Tamburo G [name] in Treatment: 5 Verbal / Phone Orders: No Diagnosis Coding Additional Orders / Instructions o OK to return to work - Monday, July  27th [facility] o Discharge from [facility] - Treatment complete Electronic Signature(s) Signed: 04/06/2019 4:35:36 PM  By: [name] Najjarobson, Taziyah Iannuzzi MD Signed: 04/06/2019 4:45:53 PM By: Elliot GurneyWoody, BSN, RN, CWS, Kim RN, BSN Entered By: Elliot GurneyWoody, BSN, RN, CWS, [name] on 04/06/2019 16:04:37 Carlton, Savio ([id number]) -------------------------------------------------------------------------------- Problem List Details Patient Name: [name] RepressSHAFFER, [name] Date of Service: 04/06/2019 3:45 PM Medical Record Number: [id number] Patient Account Number: [id number] Date of Birth/Sex: Sep 23, 1975 (43 y.o. M) Treating RN: [name] [name] Primary Care Provider: Daniel NonesKLEIN, [name] Other Clinician: Referring Provider: Daniel NonesKLEIN, [name] Treating Provider/Extender: [name] CarolinaOBSON, Jireh Vinas G [name] in Treatment: 5 Active Problems ICD-10 Evaluated Encounter Code Description Active Date Today Diagnosis L97.211 Non-pressure chronic ulcer of right calf limited to breakdown 03/02/2019 No Yes of skin L03.115 Cellulitis of right lower limb 03/02/2019 No Yes I87.311 Chronic venous hypertension (idiopathic) with ulcer of right 03/02/2019 No Yes lower extremity I89.0 Lymphedema, not elsewhere classified 03/09/2019 No Yes Inactive Problems Resolved Problems Electronic Signature(s) Signed: 04/06/2019 4:35:36 PM By: [name] Najjarobson, Nadezhda Pollitt MD Entered By: [name] Najjarobson, Oluwaferanmi Wain on 04/06/2019 16:26:01 Hue, Fannie ([id number]) -------------------------------------------------------------------------------- Progress Note Details Patient Name: [name] RepressSHAFFER, [name] Date of Service: 04/06/2019 3:45 PM Medical Record Number: [id number] Patient Account Number: [id number] Date of Birth/Sex: Sep 23, 1975 (43 y.o. M) Treating RN: [name] [name] Primary Care Provider: Daniel NonesKLEIN, [name] Other Clinician: Referring Provider: Daniel NonesKLEIN, [name] Treating Provider/Extender: Maxwell CaulOBSON, Carleton [place] G [name] in Treatment: 5 Subjective History of Present Illness (HPI) ADMISSION 03/02/2019 This is a 43 year old man who is not a diabetic. He states he was well up until 8 days ago when he felt fatigued and generally unwell while at work. He went home and  discovered intense erythema in the right leg. This was painful. He did not make it into see his doctor until 3 days later. He was put on doxycycline. He had a duplex ultrasound done at interventional radiology that did not show reflux, DVT or superficial thrombophlebitis. He was put on doxycycline which she is still taking. He was put in an Radio broadcast assistantUnna boot. He was reviewed again 2 days ago and referred here. He had an Radio broadcast assistantUnna boot on when he came in here today. Patient states he is not systemically unwell but the leg is uncomfortable. He does not have a history of wounds on his legs. He does have some discoloration of the left leg which looks like chronic venous changes however he states that the right leg did not have this prior to any changes recently. I note that he has been seen by [name] [name] at the [facility] for lower extremity edema. He had an echocardiogram. Past medical history includes hypertension, edema of both legs, obstructive sleep apnea, morbid obesity. Allergies, he is listed as being allergic to amoxicillin from 2015 and his primary doctor's office although I do not see this in epic. The patient does not remember anything about this. He does not remember being on amoxicillin. Socially; he works in a Education officer, communitysewage and water treatment. But he denies getting his leg into contact with anything particularly problematic ABIs in our clinic were noncompressible bilaterally 03/04/19 upon evaluation today patient has come in for reevaluation due to the fact that he had a significant right lower extremity cellulitis when he was seen in the office on Wednesday, two days ago. Subsequently he did not want to go to the ER for likely admission and I'll be anabiotic therapy. For that reason we actually have placed him when he saw [name] [name] Wednesday on linezolid which he has been taking since that  time. Subsequently the culture come back showing gram- negative rods and therefore Cipro was also  recommended by Dr. Leanord Hawking although that has not been seen in yet that was just this morning. The final culture and sensitivity has not returned yet. No fevers, chills, nausea, or vomiting noted at this time. As far as the markings around the leg actually appears that their theme a has receded somewhat compared to what it showed during the evaluation today is ago and he also states that is not as tender to touch which is also good news. 6/24; the culture I did of what I thought was a blister in the lower part of this wound grew gram-negative's. This was Pseudomonas and Serratia. In response to that we put him on ciprofloxacin which will end in 2 days. He also had Lunesta lid which fortunately did not cost him too much. His leg is a lot better. There is no tenderness. He has some erythema continuing but this is not tender. He was referred to Dr. dew at vein and vascular by his primary doctor. They felt that he had lymphedema and that lymphedema pumps would benefit. They are ordering a reflux study that should be done tomorrow. He has remained systemically well. Overall his cellulitis was extensive things look a lot better today than a week ago 7/1; continued copious amounts of adherent necrotic debris over the wound surface. This required an extensive debridement with a #5 curette today. We also vigorously washed this area off with Vashe solution. He will need to come back for a nurse change. This is likely secondary to extensive bioburden over the wound area. We are using 3 layer compression he appears to tolerate this fairly well. The patient had reflux studies done. This showed no evidence of deep venous thrombosis in the right. Part of the exam was limited there was no evidence of superficial vein thrombosis and no evidence of chronic venous insufficiency. 7/8; most of the patient's wound had actually begin to epithelialize. He still had probably about 30% the required debridement. using silver  alginate 7/15; the patient took the wrap off because of itching. The skin on his posterior calf is irritated but I do not think infected in fact [name] [name] ([id number]) there is no open wound here. This may be the vash solution we have been using I lost track that he was using this. We gave him his measurements for stockings he is trying to procure that 7/22; the patient's right posterior calf wound is completely healed. He has changes of chronic stasis dermatitis. He has his compression stockings at home. The patient presented initially with an intense cellulitis with soft tissue destruction in the posterior part of his leg. He refused admission to hospital but fortunately responded to oral antibiotics as directed by this clinic. He has severe bilateral stasis dermatitis changes probably some degree of secondary lymphedema Objective Constitutional Patient is hypertensive.. Pulse regular and within target range for patient.Marland Kitchen Respirations regular, non-labored and within target range.. Temperature is normal and within the target range for the patient.Marland Kitchen appears in no distress. Vitals Time Taken: 3:44 PM, Height: 66 in, Weight: 442 lbs, BMI: 71.3, Temperature: 98.5 F, Pulse: 103 bpm, Respiratory Rate: 18 breaths/min, Blood Pressure: 152/62 mmHg. Cardiovascular Pedal pulses are palpable. Edema is reasonably well controlled. Psychiatric No evidence of depression, anxiety, or agitation. Calm, cooperative, and communicative. Appropriate interactions and affect.. General Notes: Wound exam; the patient's right lower extremity has nonpitting edema. There is significant skin damage in his  right lower extremity with very fibrosed lower extremity skin. However everything is epithelialized here. There is no open wound Integumentary (Hair, Skin) He has chronic stasis dermatitis right greater than left some degree of secondary lymphedema bilaterally. There is no open wound. Wound #1 status is Healed -  Epithelialized. Original cause of wound was Gradually Appeared. The wound is located on the Right,Posterior Lower Leg. The wound measures 0cm length x 0cm width x 0cm depth; 0cm^2 area and 0cm^3 volume. There is no undermining noted. There is a none present amount of drainage noted. The wound margin is flat and intact. There is medium (34-66%) red, pale granulation within the wound bed. There is a medium (34-66%) amount of necrotic tissue within the wound bed including Adherent Slough. 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 The patient to be discharged from the wound care center 2. Advised to lubricate the skin in his bilateral lower extremities nightly and to use his stockings especially when he is at work. Electronic Signature(s) Signed: 04/06/2019 4:35:36 PM By: Linton Ham MD Entered By: Linton Ham on 04/06/2019 16:30:09 [place], Zakery ([id number]) -------------------------------------------------------------------------------- SuperBill Details Patient Name: [name], Jayquan Date of Service: 04/06/2019 Medical Record Number: [id number] Patient Account Number: [id number] Date of Birth/Sex: Feb 02, 1976 (43 y.o. 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 skin L03.115 Cellulitis of right lower limb I87.311 Chronic venous hypertension  (idiopathic) with ulcer of right lower extremity I89.0 Lymphedema, not elsewhere classified Facility Procedures CPT4 Code: 80165537 Description: [phone] - WOUND CARE VISIT-LEV 2 EST PT Modifier: Quantity: 1 Physician Procedures CPT4 Code: [id number] Description: 92010 - WC PHYS LEVEL 2 - EST PT ICD-10 Diagnosis Description L97.211 Non-pressure chronic ulcer of right calf limited to breakdown I87.311 Chronic venous hypertension (idiopathic) with ulcer of right I89.0 Lymphedema, not elsewhere  classified Modifier: of skin lower extremity Quantity: 1 Electronic Signature(s) Signed: 04/06/2019 4:58:08 PM By: Gretta Cool, BSN, RN, CWS, Kim RN, BSN Previous Signature: 04/06/2019 4:35:36 PM Version By: Linton Ham MD Entered By: Gretta Cool, BSN, RN, CWS, [name] on 04/06/2019 16:58:08

## 2019-04-07 NOTE — Progress Notes (Signed)
Walker, [name] ([id number]) Visit Report for 04/06/2019 Arrival Information Details Patient Name: [name] Walker, [name] Walker Date of Service: 04/06/2019 3:45 PM Medical Record Number: [id number] Patient Account Number: [id number] Date of Birth/Sex: 08/30/1976 (43 y.o. M) Treating RN: Harold Barban Primary Care Kharter Sestak: Ramonita Lab Other Clinician: Referring Rorie Delmore: Ramonita Lab Treating Odyssey Vasbinder/Extender: Tito Dine in Treatment: 5 Visit Information History Since Last Visit Added or deleted any medications: No Patient Arrived: Ambulatory Any new allergies or adverse reactions: No Arrival Time: 15:43 Had a fall or experienced change in No Accompanied By: self activities of daily living that may affect Transfer Assistance: None risk of falls: Patient Identification Verified: Yes Signs or symptoms of abuse/neglect since last visito No Secondary Verification Process Yes Hospitalized since last visit: No Completed: Has Dressing in Place as Prescribed: Yes Patient Has Alerts: Yes Pain Present Now: No Patient Alerts: Non- compressible Electronic Signature(s) Signed: 04/07/2019 3:44:22 PM By: Harold Barban Entered By: Harold Barban on 04/06/2019 15:44:14 Vanleeuwen, Jamari ([id number]) -------------------------------------------------------------------------------- Clinic Level of Care Assessment Details Patient Name: Walker, [name] Date of Service: 04/06/2019 3:45 PM Medical Record Number: [id number] Patient Account Number: [id number] Date of Birth/Sex: April 20, 1976 (43 y.o. M) Treating RN: Cornell Barman Primary Care Hiroko Tregre: Ramonita Lab Other Clinician: Referring Jaquisha Frech: Ramonita Lab Treating Geneva Pallas/Extender: Tito Dine in Treatment: 5 Clinic Level of Care Assessment Items TOOL 4 Quantity Score []  - Use when only an EandM is performed on FOLLOW-UP visit 0 ASSESSMENTS - Nursing Assessment / Reassessment []  - Reassessment of Co-morbidities (includes updates in patient  status) 0 X- 1 5 Reassessment of Adherence to Treatment Plan ASSESSMENTS - Wound and Skin Assessment / Reassessment X - Simple Wound Assessment / Reassessment - one wound 1 5 []  - 0 Complex Wound Assessment / Reassessment - multiple wounds []  - 0 Dermatologic / Skin Assessment (not related to wound area) ASSESSMENTS - Focused Assessment []  - Circumferential Edema Measurements - multi extremities 0 []  - 0 Nutritional Assessment / Counseling / Intervention []  - 0 Lower Extremity Assessment (monofilament, tuning fork, pulses) []  - 0 Peripheral Arterial Disease Assessment (using hand held doppler) ASSESSMENTS - Ostomy and/or Continence Assessment and Care []  - Incontinence Assessment and Management 0 []  - 0 Ostomy Care Assessment and Management (repouching, etc.) PROCESS - Coordination of Care X - Simple Patient / Family Education for ongoing care 1 15 []  - 0 Complex (extensive) Patient / Family Education for ongoing care []  - 0 Staff obtains Programmer, systems, Records, Test Results / Process Orders []  - 0 Staff telephones HHA, Nursing Homes / Clarify orders / etc []  - 0 Routine Transfer to another Facility (non-emergent condition) []  - 0 Routine Hospital Admission (non-emergent condition) []  - 0 New Admissions / Biomedical engineer / Ordering NPWT, Apligraf, etc. []  - 0 Emergency Hospital Admission (emergent condition) X- 1 10 Simple Discharge Coordination Kalla, Dearl ([id number]) []  - 0 Complex (extensive) Discharge Coordination PROCESS - Special Needs []  - Pediatric / Minor Patient Management 0 []  - 0 Isolation Patient Management []  - 0 Hearing / Language / Visual special needs []  - 0 Assessment of Community assistance (transportation, D/C planning, etc.) []  - 0 Additional assistance / Altered mentation []  - 0 Support Surface(s) Assessment (bed, cushion, seat, etc.) INTERVENTIONS - Wound Cleansing / Measurement []  - Simple Wound Cleansing - one wound 0 []  -  0 Complex Wound Cleansing - multiple wounds X- 1 5 Wound Imaging (photographs - any number of wounds) []  - 0 Wound Tracing (instead of photographs) []  - 0 Simple  Wound Measurement - one wound []  - 0 Complex Wound Measurement - multiple wounds INTERVENTIONS - Wound Dressings []  - Small Wound Dressing one or multiple wounds 0 []  - 0 Medium Wound Dressing one or multiple wounds []  - 0 Large Wound Dressing one or multiple wounds []  - 0 Application of Medications - topical []  - 0 Application of Medications - injection INTERVENTIONS - Miscellaneous []  - External ear exam 0 []  - 0 Specimen Collection (cultures, biopsies, blood, body fluids, etc.) []  - 0 Specimen(s) / Culture(s) sent or taken to Lab for analysis []  - 0 Patient Transfer (multiple staff / Nurse, adultHoyer Lift / Similar devices) []  - 0 Simple Staple / Suture removal (25 or less) []  - 0 Complex Staple / Suture removal (26 or more) []  - 0 Hypo / Hyperglycemic Management (close monitor of Blood Glucose) []  - 0 Ankle / Brachial Index (ABI) - do not check if billed separately X- 1 5 Vital Signs Passero, Limuel ([id number]) Has the patient been seen at the hospital within the last three years: Yes Total Score: 45 Level Of Care: New/Established - Level 2 Electronic Signature(s) Signed: 04/06/2019 5:08:56 PM By: Elliot GurneyWoody, BSN, RN, CWS, Kim RN, BSN Entered By: Elliot GurneyWoody, BSN, RN, CWS, Kim on 04/06/2019 16:57:58 Osberg, Seamus ([id number]) -------------------------------------------------------------------------------- Lower Extremity Assessment Details Patient Name: [name] Walker LLCHAFFER, [name] Date of Service: 04/06/2019 3:45 PM Medical Record Number: [id number] Patient Account Number: [id number] Date of Birth/Sex: 1976-09-07 (43 y.o. M) Treating RN: Arnette NorrisBiell, Kristina Primary Care Caela Huot: Daniel NonesKLEIN, BERT Other Clinician: Referring Khani Paino: Daniel NonesKLEIN, BERT Treating Terissa Haffey/Extender: Maxwell CaulOBSON, MICHAEL G Weeks in Treatment: 5 Vascular Assessment Pulses: Dorsalis  Pedis Palpable: [Right:Yes] Posterior Tibial Palpable: [Right:Yes] Electronic Signature(s) Signed: 04/07/2019 3:44:22 PM By: Arnette NorrisBiell, Kristina Entered By: Arnette NorrisBiell, Kristina on 04/06/2019 15:56:32 Heymann, Avedis ([id number]) -------------------------------------------------------------------------------- Multi Wound Chart Details Patient Name: [name], Shawan Date of Service: 04/06/2019 3:45 PM Medical Record Number: [id number] Patient Account Number: [id number] Date of Birth/Sex: 1976-09-07 (43 y.o. M) Treating RN: Huel CoventryWoody, Kim Primary Care Alfonse Garringer: Daniel NonesKLEIN, BERT Other Clinician: Referring Bellatrix Devonshire: Daniel NonesKLEIN, BERT Treating Yoskar Murrillo/Extender: Altamese CarolinaOBSON, MICHAEL G Weeks in Treatment: 5 Vital Signs Height(in): 66 Pulse(bpm): 103 Weight(lbs): 442 Blood Pressure(mmHg): 152/62 Body Mass Index(BMI): 71 Temperature(F): 98.5 Respiratory Rate 18 (breaths/min): Photos: [N/A:N/A] Wound Location: Right Lower Leg - Posterior N/A N/A Wounding Event: Gradually Appeared N/A N/A Primary Etiology: Lymphedema N/A N/A Secondary Etiology: Venous Leg Ulcer N/A N/A Comorbid History: Hypertension N/A N/A Date Acquired: 02/24/2019 N/A N/A Weeks of Treatment: 5 N/A N/A Wound Status: Open N/A N/A Measurements L x W x D 0.1x0.1x0.1 N/A N/A (cm) Area (cm) : 0.008 N/A N/A Volume (cm) : 0.001 N/A N/A % Reduction in Area: 100.00% N/A N/A % Reduction in Volume: 100.00% N/A N/A Classification: Full Thickness Without N/A N/A Exposed Support Structures Exudate Amount: None Present N/A N/A Wound Margin: Flat and Intact N/A N/A Granulation Amount: Medium (34-66%) N/A N/A Granulation Quality: Red, Pale N/A N/A Necrotic Amount: Medium (34-66%) N/A N/A Exposed Structures: Fascia: No N/A N/A Fat Layer (Subcutaneous Tissue) Exposed: No Tendon: No Muscle: No Joint: No Bone: No Epithelialization: None N/A N/A Spickard, Zvi ([id number]) Treatment Notes Electronic Signature(s) Signed: 04/06/2019 4:45:53 PM By: Elliot GurneyWoody, BSN,  RN, CWS, Kim RN, BSN Entered By: Elliot GurneyWoody, BSN, RN, CWS, Kim on 04/06/2019 16:01:24 Pasko, Santhiago ([id number]) -------------------------------------------------------------------------------- Multi-Disciplinary Care Plan Details Patient Name: [name], Aldean Date of Service: 04/06/2019 3:45 PM Medical Record Number: [id number] Patient Account Number: [id number] Date of Birth/Sex: 1976-09-07 ([age] y.o. M) Treating RN: Huel CoventryWoody, Kim Primary Care Sabian Kuba: Daniel NonesKLEIN, BERT  Other Clinician: Referring Torra Pala: Daniel NonesKLEIN, BERT Treating Lorea Kupfer/Extender: Altamese CarolinaOBSON, MICHAEL G Weeks in Treatment: 5 Active Inactive Electronic Signature(s) Signed: 04/06/2019 4:45:53 PM By: Elliot GurneyWoody, BSN, RN, CWS, Kim RN, BSN Entered By: Elliot GurneyWoody, BSN, RN, CWS, Kim on 04/06/2019 16:01:12 Anzalone, Waco ([id number]) -------------------------------------------------------------------------------- Pain Assessment Details Patient Name: [name], Joseth Date of Service: 04/06/2019 3:45 PM Medical Record Number: [id number] Patient Account Number: [id number] Date of Birth/Sex: 08-06-1976 (43 y.o. M) Treating RN: Arnette NorrisBiell, Kristina Primary Care Dmari Schubring: Daniel NonesKLEIN, BERT Other Clinician: Referring Ryah Cribb: Daniel NonesKLEIN, BERT Treating Janus Vlcek/Extender: Maxwell CaulOBSON, MICHAEL G Weeks in Treatment: 5 Active Problems Location of Pain Severity and Description of Pain Patient Has Paino No Site Locations Pain Management and Medication Current Pain Management: Electronic Signature(s) Signed: 04/07/2019 3:44:22 PM By: Arnette NorrisBiell, Kristina Entered By: Arnette NorrisBiell, Kristina on 04/06/2019 15:44:21 Kassner, Cire ([id number]) -------------------------------------------------------------------------------- Wound Assessment Details Patient Name: [name], Garner Date of Service: 04/06/2019 3:45 PM Medical Record Number: [id number] Patient Account Number: [id number] Date of Birth/Sex: 08-06-1976 (43 y.o. M) Treating RN: Huel CoventryWoody, Kim Primary Care Shavonna Corella: Daniel NonesKLEIN, BERT Other Clinician: Referring Caige Almeda:  Daniel NonesKLEIN, BERT Treating Shaelynn Dragos/Extender: Maxwell CaulOBSON, MICHAEL G Weeks in Treatment: 5 Wound Status Wound Number: 1 Primary Etiology: Lymphedema Wound Location: Right, Posterior Lower Leg Secondary Etiology: Venous Leg Ulcer Wounding Event: Gradually Appeared Wound Status: Healed - Epithelialized Date Acquired: 02/24/2019 Comorbid History: Hypertension Weeks Of Treatment: 5 Clustered Wound: No Photos Wound Measurements Length: (cm) 0 Width: (cm) 0 Depth: (cm) 0 Area: (cm) 0 Volume: (cm) 0 % Reduction in Area: 100% % Reduction in Volume: 100% Epithelialization: None Undermining: No Wound Description Full Thickness Without Exposed Support Foul O Classification: Structures Slough Wound Margin: Flat and Intact Exudate None Present Amount: dor After Cleansing: No /Fibrino No Wound Bed Granulation Amount: Medium (34-66%) Exposed Structure Granulation Quality: Red, Pale Fascia Exposed: No Necrotic Amount: Medium (34-66%) Fat Layer (Subcutaneous Tissue) Exposed: No Necrotic Quality: Adherent Slough Tendon Exposed: No Muscle Exposed: No Joint Exposed: No Bone Exposed: No Electronic Signature(s) Signed: 04/06/2019 4:45:53 PM By: Elliot GurneyWoody, BSN, RN, CWS, Kim RN, BSN VernonSHAFFER, Pio ([id number]) Entered By: Elliot GurneyWoody, BSN, RN, CWS, Kim on 04/06/2019 16:01:57 Grays, Ashaun ([id number]) -------------------------------------------------------------------------------- Vitals Details Patient Name: [name], Juquan Date of Service: 04/06/2019 3:45 PM Medical Record Number: [id number] Patient Account Number: [id number] Date of Birth/Sex: 08-06-1976 (43 y.o. M) Treating RN: Arnette NorrisBiell, Kristina Primary Care Beverlyn Mcginness: Daniel NonesKLEIN, BERT Other Clinician: Referring Nelva Hauk: Daniel NonesKLEIN, BERT Treating Doc Mandala/Extender: Altamese CarolinaOBSON, MICHAEL G Weeks in Treatment: 5 Vital Signs Time Taken: 15:44 Temperature (F): 98.5 Height (in): 66 Pulse (bpm): 103 Weight (lbs): 442 Respiratory Rate (breaths/min): 18 Body Mass Index  (BMI): 71.3 Blood Pressure (mmHg): 152/62 Reference Range: 80 - 120 mg / dl Electronic Signature(s) Signed: 04/07/2019 3:44:22 PM By: Arnette NorrisBiell, Kristina Entered By: Arnette NorrisBiell, Kristina on 04/06/2019 15:44:48

## 2019-04-15 ENCOUNTER — Telehealth (INDEPENDENT_AMBULATORY_CARE_PROVIDER_SITE_OTHER): Payer: Self-pay | Admitting: Vascular Surgery

## 2019-04-15 NOTE — Telephone Encounter (Signed)
Have patient schedule come in to see either myself or dew next week

## 2019-04-15 NOTE — Telephone Encounter (Signed)
Can you please call patient to schedule apt to be seen per [facility]. AS, CMA

## 2019-04-15 NOTE — Telephone Encounter (Signed)
PATIENT HAS A APPOINTMENT ON 04-20-19 WITH [facility].

## 2019-04-15 NOTE — Telephone Encounter (Signed)
Patient calling stating that his legs have started swelling and leaking x 1 day (patient started work back Thursday and symptoms started). Stating that his legs have some redness, no discoloration, drainage and are painful to walk on and touch. Patient requesting apt. Please advise. AS, CMA

## 2019-04-19 DIAGNOSIS — I890 Lymphedema, not elsewhere classified: Secondary | ICD-10-CM | POA: Diagnosis not present

## 2019-04-20 ENCOUNTER — Encounter (INDEPENDENT_AMBULATORY_CARE_PROVIDER_SITE_OTHER): Payer: Self-pay | Admitting: Nurse Practitioner

## 2019-04-20 ENCOUNTER — Ambulatory Visit (INDEPENDENT_AMBULATORY_CARE_PROVIDER_SITE_OTHER): Payer: BC Managed Care – PPO | Admitting: Nurse Practitioner

## 2019-04-20 ENCOUNTER — Other Ambulatory Visit: Payer: Self-pay

## 2019-04-20 ENCOUNTER — Encounter (INDEPENDENT_AMBULATORY_CARE_PROVIDER_SITE_OTHER): Payer: Self-pay

## 2019-04-20 VITALS — BP 165/98 | HR 90 | Resp 16 | Wt >= 6400 oz

## 2019-04-20 DIAGNOSIS — I890 Lymphedema, not elsewhere classified: Secondary | ICD-10-CM | POA: Diagnosis not present

## 2019-04-20 DIAGNOSIS — L97211 Non-pressure chronic ulcer of right calf limited to breakdown of skin: Secondary | ICD-10-CM

## 2019-04-20 DIAGNOSIS — I10 Essential (primary) hypertension: Secondary | ICD-10-CM | POA: Diagnosis not present

## 2019-04-20 DIAGNOSIS — L03115 Cellulitis of right lower limb: Secondary | ICD-10-CM

## 2019-04-20 MED ORDER — AMOXICILLIN-POT CLAVULANATE 875-125 MG PO TABS: 1.0000 | ORAL_TABLET | Freq: Two times a day (BID) | ORAL | 0 refills | Status: DC

## 2019-04-26 ENCOUNTER — Encounter (INDEPENDENT_AMBULATORY_CARE_PROVIDER_SITE_OTHER): Payer: Self-pay | Admitting: Nurse Practitioner

## 2019-04-26 ENCOUNTER — Ambulatory Visit (INDEPENDENT_AMBULATORY_CARE_PROVIDER_SITE_OTHER): Payer: BC Managed Care – PPO | Admitting: Nurse Practitioner

## 2019-04-26 ENCOUNTER — Other Ambulatory Visit: Payer: Self-pay

## 2019-04-26 VITALS — BP 137/82 | HR 100 | Resp 10 | Ht 66.0 in | Wt >= 6400 oz

## 2019-04-26 DIAGNOSIS — L97211 Non-pressure chronic ulcer of right calf limited to breakdown of skin: Secondary | ICD-10-CM | POA: Diagnosis not present

## 2019-04-26 DIAGNOSIS — I890 Lymphedema, not elsewhere classified: Secondary | ICD-10-CM

## 2019-04-26 NOTE — Progress Notes (Signed)
..  History of Present Illness  There is no documented history at this time  Assessments & Plan   There are no diagnoses linked to this encounter.    Additional instructions  Subjective:  Patient presents with venous ulcer of the Bilateral lower extremity.    Procedure:  3 layer unna wrap was placed Bilateral lower extremity.   Plan:   Follow up in one week.   Patient came in for R leg unna wrap but had edema and small blisters on L leg. Per Arna Medici- apply calamine unna wraps.

## 2019-04-26 NOTE — Progress Notes (Addendum)
SUBJECTIVE:  Patient ID: [name] Walker, male    DOB: Oct 16, 1975, 43 y.o.   MRN: [id number] Chief Complaint  Patient presents with  . Follow-up    HPI  [name] Walker is a 43 y.o. male that presents today due to the fact that he began to have swelling and leaking of his lower extremities.  He states that he had been back at work for approximately a day the symptoms started.  He also states that he has had some redness of his right lower extremity.  He states that his right lower extremity is extremely painful and that the leaking is worse from his leg.  There is also a small ulceration beginning to form.  The patient has medical grade 1 compression stockings however he states that at this time his legs are so swollen he is unable to wear them properly.  He was evaluated for lymphedema pump however due to his insurance he was unable to forward at that time.  He denies any fever, chills, nausea, vomiting or diarrhea.  He denies any chest pain or shortness of breath.  Past Medical History:  Diagnosis Date  . Hypertension   . Sleep apnea     Past Surgical History:  Procedure Laterality Date  . HERNIA REPAIR      Social History   Socioeconomic History  . Marital status: Single    Spouse name: Not on file  . Number of children: Not on file  . Years of education: Not on file  . Highest education level: Not on file  Occupational History  . Not on file  Social Needs  . Financial resource strain: Not on file  . Food insecurity    Worry: Not on file    Inability: Not on file  . Transportation needs    Medical: Not on file    Non-medical: Not on file  Tobacco Use  . Smoking status: Never Smoker  . Smokeless tobacco: Never Used  Substance and Sexual Activity  . Alcohol use: Yes    Alcohol/week: 24.0 standard drinks    Types: 24 Cans of beer per week  . Drug use: Not Currently  . Sexual activity: Not on file  Lifestyle  . Physical activity    Days per week: Not on file    Minutes  per session: Not on file  . Stress: Not on file  Relationships  . Social Musicianconnections    Talks on phone: Not on file    Gets together: Not on file    Attends religious service: Not on file    Active member of club or organization: Not on file    Attends meetings of clubs or organizations: Not on file    Relationship status: Not on file  . Intimate partner violence    Fear of current or ex partner: Not on file    Emotionally abused: Not on file    Physically abused: Not on file    Forced sexual activity: Not on file  Other Topics Concern  . Not on file  Social History Narrative  . Not on file    Family History  Problem Relation Age of Onset  . Heart disease Father   . Hypertension Father   . Arthritis Father     No Known Allergies   Review of Systems   Review of Systems: Negative Unless Checked Constitutional: [] Weight loss  [] Fever  [] Chills Cardiac: [] Chest pain   []  Atrial Fibrillation  [] Palpitations   [] Shortness of breath  when laying flat   [] Shortness of breath with exertion. [] Shortness of breath at rest Vascular:  [] Pain in legs with walking   [] Pain in legs with standing [] Pain in legs when laying flat   [] Claudication    [] Pain in feet when laying flat    [] History of DVT   [] Phlebitis   [x] Swelling in legs   [x] Varicose veins   [] Non-healing ulcers Pulmonary:   [] Uses home oxygen   [] Productive cough   [] Hemoptysis   [] Wheeze  [] COPD   [] Asthma Neurologic:  [] Dizziness   [] Seizures  [] Blackouts [] History of stroke   [] History of TIA  [] Aphasia   [] Temporary Blindness   [] Weakness or numbness in arm   [] Weakness or numbness in leg Musculoskeletal:   [] Joint swelling   [] Joint pain   [] Low back pain  []  History of Knee Replacement [x] Arthritis [] back Surgeries  []  Spinal Stenosis    Hematologic:  [] Easy bruising  [] Easy bleeding   [] Hypercoagulable state   [] Anemic Gastrointestinal:  [] Diarrhea   [] Vomiting  [] Gastroesophageal reflux/heartburn   [] Difficulty  swallowing. [] Abdominal pain Genitourinary:  [] Chronic kidney disease   [] Difficult urination  [] Anuric   [] Blood in urine [] Frequent urination  [] Burning with urination   [] Hematuria Skin:  [] Rashes   [x] Ulcers [] Wounds Psychological:  [] History of anxiety   []  History of major depression  []  Memory Difficulties      OBJECTIVE:   Physical Exam  BP (!) 165/98 (BP Location: Right Arm)   Pulse 90   Resp 16   Wt (!) 430 lb (195 kg)   BMI 69.40 kg/m   Gen: WD/WN, NAD Head: [place]/AT, No temporalis wasting.  Ear/Nose/Throat: Hearing grossly intact, nares w/o erythema or drainage Eyes: PER, EOMI, sclera nonicteric.  Neck: Supple, no masses.  No JVD.  Pulmonary:  Good air movement, no use of accessory muscles.  Cardiac: RRR Vascular:  Unable to palpate pulses due to body habitus however bilateral feet are warm.  Weeping of the right lower extremity.  Early blisters forming as well as early ulceration.  3+ edema bilaterally Vessel Right Left  Radial Palpable Palpable   Gastrointestinal: soft, non-distended. No guarding/no peritoneal signs.  Musculoskeletal: M/S 5/5 throughout.  No deformity or atrophy.  Neurologic: Pain and light touch intact in extremities.  Symmetrical.  Speech is fluent. Motor exam as listed above. Psychiatric: Judgment intact, Mood & affect appropriate for pt's clinical situation. Dermatologic: Small venous ulcer on the right as well as cellulitic changes on the right. Lymph : No Cervical lymphadenopathy, bilateral dermal thickening       ASSESSMENT AND PLAN:  1. Lower limb ulcer, calf, right, limited to breakdown of skin (HCC) No surgery or intervention at this point in time.    I have had a long discussion with the patient regarding venous insufficiency and why it  causes symptoms, specifically venous ulceration . I have discussed with the patient the chronic skin changes that accompany venous insufficiency and the long term sequela such as infection and recurring   ulceration.  Patient will be placed in [employer]na Boots which will be changed weekly drainage permitting.  In addition, behavioral modification including several periods of elevation of the lower extremities during the day will be continued. Achieving a position with the ankles at heart level was stressed to the patient  The patient is instructed to begin routine exercise, especially walking on a daily basis  We will see the patient back in office in 4 weeks for evaluation of lower extremities.  2. Lymphedema  No surgery or intervention at this point in time.    I have reviewed my previous discussion with the patient regarding swelling and why it  causes symptoms.  The patient is doing well with compression and will continue wearing graduated compression stockings class 1 (20-30 mmHg) on a daily basis a prescription was given. The patient will  continue wearing the stockings first thing in the morning and removing them in the evening. The patient is instructed specifically not to sleep in the stockings.    In addition, behavioral modification including elevation during the day and exercise will be continued.      3. Essential hypertension Continue antihypertensive medications as already ordered, these medications have been reviewed and there are no changes at this time.   4. Cellulitis of right lower extremity Patient was given Bactrim.  We will evaluate status following Unna wrap therapy.   Current Outpatient Medications on File Prior to Visit  Medication Sig Dispense Refill  . benazepril-hydrochlorthiazide (LOTENSIN HCT) 20-25 MG per tablet Take 2 tablets by mouth daily.    . furosemide (LASIX) 40 MG tablet TAKE 1 TABLET BY MOUTH ONCE DAILY AS NEEDED FOR EDEMA    . linezolid (ZYVOX) 600 MG tablet Take 600 mg by mouth 2 (two) times daily. for 7 days    . potassium chloride (K-DUR) 10 MEQ tablet Take by mouth.    . telmisartan (MICARDIS) 40 MG tablet Take 40 mg by mouth daily.    Marland Kitchen  testosterone cypionate (DEPOTESTOTERONE CYPIONATE) 200 MG/ML injection Inject 200 mg into the muscle every 7 (seven) days.    . ciprofloxacin (CIPRO) 500 MG tablet TAKE 1 TABLET BY MOUTH TWICE DAILY FOR 7 DAYS (TAKE THIS ALONG WITH LINEZOLID)     No current facility-administered medications on file prior to visit.     There are no Patient Instructions on file for this visit. No follow-ups on file.   Kris Hartmann, NP  This note was completed with Sales executive.  Any errors are purely unintentional.

## 2019-04-27 ENCOUNTER — Encounter (INDEPENDENT_AMBULATORY_CARE_PROVIDER_SITE_OTHER): Payer: Self-pay

## 2019-04-28 ENCOUNTER — Encounter (INDEPENDENT_AMBULATORY_CARE_PROVIDER_SITE_OTHER): Payer: Self-pay | Admitting: Nurse Practitioner

## 2019-05-04 ENCOUNTER — Other Ambulatory Visit: Payer: Self-pay

## 2019-05-04 ENCOUNTER — Ambulatory Visit (INDEPENDENT_AMBULATORY_CARE_PROVIDER_SITE_OTHER): Payer: BC Managed Care – PPO | Admitting: Nurse Practitioner

## 2019-05-04 VITALS — BP 185/84 | HR 102 | Resp 14 | Ht 65.0 in | Wt >= 6400 oz

## 2019-05-04 DIAGNOSIS — L97211 Non-pressure chronic ulcer of right calf limited to breakdown of skin: Secondary | ICD-10-CM

## 2019-05-04 NOTE — Progress Notes (Signed)
History of Present Illness  There is no documented history at this time  Assessments & Plan   There are no diagnoses linked to this encounter.    Additional instructions  Subjective:  Patient presents with venous ulcer of the Bilateral lower extremity.    Procedure:  3 layer unna wrap was placed Bilateral lower extremity.   Plan:   Follow up in one week.  

## 2019-05-06 ENCOUNTER — Other Ambulatory Visit (INDEPENDENT_AMBULATORY_CARE_PROVIDER_SITE_OTHER): Payer: Self-pay | Admitting: Nurse Practitioner

## 2019-05-06 DIAGNOSIS — L97929 Non-pressure chronic ulcer of unspecified part of left lower leg with unspecified severity: Secondary | ICD-10-CM

## 2019-05-06 DIAGNOSIS — L97919 Non-pressure chronic ulcer of unspecified part of right lower leg with unspecified severity: Secondary | ICD-10-CM

## 2019-05-08 ENCOUNTER — Encounter (INDEPENDENT_AMBULATORY_CARE_PROVIDER_SITE_OTHER): Payer: Self-pay | Admitting: Nurse Practitioner

## 2019-05-11 ENCOUNTER — Ambulatory Visit (INDEPENDENT_AMBULATORY_CARE_PROVIDER_SITE_OTHER): Payer: BC Managed Care – PPO | Admitting: Nurse Practitioner

## 2019-05-11 ENCOUNTER — Other Ambulatory Visit: Payer: Self-pay

## 2019-05-11 ENCOUNTER — Encounter (INDEPENDENT_AMBULATORY_CARE_PROVIDER_SITE_OTHER): Payer: Self-pay | Admitting: Nurse Practitioner

## 2019-05-11 ENCOUNTER — Ambulatory Visit (INDEPENDENT_AMBULATORY_CARE_PROVIDER_SITE_OTHER): Payer: BC Managed Care – PPO

## 2019-05-11 VITALS — BP 184/84 | HR 96 | Resp 16 | Ht 65.0 in | Wt >= 6400 oz

## 2019-05-11 DIAGNOSIS — I890 Lymphedema, not elsewhere classified: Secondary | ICD-10-CM | POA: Diagnosis not present

## 2019-05-11 DIAGNOSIS — E785 Hyperlipidemia, unspecified: Secondary | ICD-10-CM | POA: Diagnosis not present

## 2019-05-11 DIAGNOSIS — Z8639 Personal history of other endocrine, nutritional and metabolic disease: Secondary | ICD-10-CM | POA: Insufficient documentation

## 2019-05-11 DIAGNOSIS — I10 Essential (primary) hypertension: Secondary | ICD-10-CM | POA: Diagnosis not present

## 2019-05-11 DIAGNOSIS — L97211 Non-pressure chronic ulcer of right calf limited to breakdown of skin: Secondary | ICD-10-CM | POA: Diagnosis not present

## 2019-05-11 DIAGNOSIS — L97929 Non-pressure chronic ulcer of unspecified part of left lower leg with unspecified severity: Secondary | ICD-10-CM | POA: Diagnosis not present

## 2019-05-11 DIAGNOSIS — G4730 Sleep apnea, unspecified: Secondary | ICD-10-CM | POA: Insufficient documentation

## 2019-05-11 DIAGNOSIS — L97919 Non-pressure chronic ulcer of unspecified part of right lower leg with unspecified severity: Secondary | ICD-10-CM

## 2019-05-15 ENCOUNTER — Encounter (INDEPENDENT_AMBULATORY_CARE_PROVIDER_SITE_OTHER): Payer: Self-pay | Admitting: Nurse Practitioner

## 2019-05-15 NOTE — Progress Notes (Signed)
SUBJECTIVE:  Patient ID: [name] Walker, male    DOB: [birth year], 43 y.o.   MRN: [id number] Chief Complaint  Patient presents with  . Follow-up    HPI  [name] Walker is a 43 y.o. male that presents today for evaluation of edema and ulceration .  The edema is much improved following unna wrap therapy.  He continues to have a very small ulceration on the right lower extremity.  He denies any fever, chills, nausea and vomiting.  He states that lower extremity pain has been better following unna wrap therapy.  The patient under went non invasive studies today which revealed an ABI of 0.99 on the right and 0.97 on the left.  He has biphasic waveforms in his bilateral tibial arteries with strong toe waveforms bilaterally   Past Medical History:  Diagnosis Date  . Hypertension   . Sleep apnea     Past Surgical History:  Procedure Laterality Date  . HERNIA REPAIR      Social History   Socioeconomic History  . Marital status: Single    Spouse name: Not on file  . Number of children: Not on file  . Years of education: Not on file  . Highest education level: Not on file  Occupational History  . Not on file  Social Needs  . Financial resource strain: Not on file  . Food insecurity    Worry: Not on file    Inability: Not on file  . Transportation needs    Medical: Not on file    Non-medical: Not on file  Tobacco Use  . Smoking status: Never Smoker  . Smokeless tobacco: Never Used  Substance and Sexual Activity  . Alcohol use: Yes    Alcohol/week: 24.0 standard drinks    Types: 24 Cans of beer per week  . Drug use: Not Currently  . Sexual activity: Not on file  Lifestyle  . Physical activity    Days per week: Not on file    Minutes per session: Not on file  . Stress: Not on file  Relationships  . Social Musicianconnections    Talks on phone: Not on file    Gets together: Not on file    Attends religious service: Not on file    Active member of club or organization: Not on file    Attends meetings of clubs or organizations: Not on file    Relationship status: Not on file  . Intimate partner violence    Fear of current or ex partner: Not on file    Emotionally abused: Not on file    Physically abused: Not on file    Forced sexual activity: Not on file  Other Topics Concern  . Not on file  Social History Narrative  . Not on file    Family History  Problem Relation Age of Onset  . Heart disease Father   . Hypertension Father   . Arthritis Father     Allergies  Allergen Reactions  . Amoxicillin Other (See Comments)    Other reaction(s): Unknown  . Hydrochlorothiazide W-Triamterene     Other reaction(s): Other (See Comments), Unknown     Review of Systems   Review of Systems: Negative Unless Checked Constitutional: [] Weight loss  [] Fever  [] Chills Cardiac: [] Chest pain   []  Atrial Fibrillation  [] Palpitations   [] Shortness of breath when laying flat   [] Shortness of breath with exertion. [] Shortness of breath at rest Vascular:  [] Pain in legs with walking   [] Pain  in legs with standing [] Pain in legs when laying flat   [] Claudication    [] Pain in feet when laying flat    [] History of DVT   [] Phlebitis   [x] Swelling in legs   [x] Varicose veins   [] Non-healing ulcers Pulmonary:   [] Uses home oxygen   [] Productive cough   [] Hemoptysis   [] Wheeze  [] COPD   [] Asthma Neurologic:  [] Dizziness   [] Seizures  [] Blackouts [] History of stroke   [] History of TIA  [] Aphasia   [] Temporary Blindness   [] Weakness or numbness in arm   [] Weakness or numbness in leg Musculoskeletal:   [] Joint swelling   [] Joint pain   [] Low back pain  []  History of Knee Replacement [] Arthritis [] back Surgeries  []  Spinal Stenosis    Hematologic:  [] Easy bruising  [] Easy bleeding   [] Hypercoagulable state   [x] Anemic Gastrointestinal:  [] Diarrhea   [] Vomiting  [] Gastroesophageal reflux/heartburn   [] Difficulty swallowing. [] Abdominal pain Genitourinary:  [] Chronic kidney disease   [] Difficult  urination  [] Anuric   [] Blood in urine [] Frequent urination  [] Burning with urination   [] Hematuria Skin:  [x] Rashes   [x] Ulcers [] Wounds Psychological:  [] History of anxiety   []  History of major depression  []  Memory Difficulties      OBJECTIVE:   Physical Exam  BP (!) 184/84 (BP Location: Right Wrist, Patient Position: Sitting, Cuff Size: Large)   Pulse 96   Resp 16   Ht 5\' 5"  (1.651 m)   Wt (!) 429 lb (194.6 kg)   BMI 71.39 kg/m   Gen: WD/WN, NAD Head: [place]/AT, No temporalis wasting.  Ear/Nose/Throat: Hearing grossly intact, nares w/o erythema or drainage Eyes: PER, EOMI, sclera nonicteric.  Neck: Supple, no masses.  No JVD.  Pulmonary:  Good air movement, no use of accessory muscles.  Cardiac: RRR Vascular:  Vessel Right Left  Radial Palpable Palpable  Dorsalis Pedis Palpable Palpable  Posterior Tibial Palpable Palpable   Gastrointestinal: soft, non-distended. No guarding/no peritoneal signs.  Musculoskeletal: M/S 5/5 throughout.  No deformity or atrophy.  Neurologic: Pain and light touch intact in extremities.  Symmetrical.  Speech is fluent. Motor exam as listed above. Psychiatric: Judgment intact, Mood & affect appropriate for pt's clinical situation. Dermatologic: No Venous rashes. Very small shallow ulcer on right lower extremity.  No changes consistent with cellulitis. Bilateral stasis dermatitis.   Lymph : No Cervical lymphadenopathy, no lichenification or skin changes of chronic lymphedema.       ASSESSMENT AND PLAN:  1. Lower limb ulcer, calf, right, limited to breakdown of skin [facility]) Patient still has a small area of ulceration but wishes to come out of unna wraps.  Patient is instructed to follow conservative treatment measures.  Also advised to use antibiotic on wound area with dressing.  Patient will follow up in one month for evaluation, instructed to contact us sooner if wound worsens.  2. Lymphedema No surgery or intervention at this point in time.     I have reviewed my discussion with the patient regarding venous insufficiency and secondary lymph edema and why it  causes symptoms. I have discussed with the patient the chronic skin changes that accompany these problems and the long term sequela such as ulceration and infection.  Patient will continue wearing graduated compression stockings class 1 (20-30 mmHg) on a daily basis a prescription was given to the patient to keep this updated. The patient will  put the stockings on first thing in the morning and removing them in the evening. The patient is instructed  specifically not to sleep in the stockings.  In addition, behavioral modification including elevation during the day will be continued.  Diet and salt restriction was also discussed.  Previous duplex ultrasound of the lower extremities shows normal deep venous system, superficial reflux was not present.      3. Hyperlipidemia, unspecified hyperlipidemia type Continue statin as ordered and reviewed, no changes at this time   4. Essential hypertension Continue antihypertensive medications as already ordered, these medications have been reviewed and there are no changes at this time.    Current Outpatient Medications on File Prior to Visit  Medication Sig Dispense Refill  . amoxicillin-clavulanate (AUGMENTIN) 875-125 MG tablet Take 1 tablet by mouth 2 (two) times daily. 14 tablet 0  . benazepril-hydrochlorthiazide (LOTENSIN HCT) 20-25 MG per tablet Take 2 tablets by mouth daily.    . ciprofloxacin (CIPRO) 500 MG tablet TAKE 1 TABLET BY MOUTH TWICE DAILY FOR 7 DAYS (TAKE THIS ALONG WITH LINEZOLID)    . furosemide (LASIX) 40 MG tablet TAKE 1 TABLET BY MOUTH ONCE DAILY AS NEEDED FOR EDEMA    . linezolid (ZYVOX) 600 MG tablet Take 600 mg by mouth 2 (two) times daily. for 7 days    . potassium chloride (K-DUR) 10 MEQ tablet Take by mouth.    . telmisartan (MICARDIS) 40 MG tablet Take 40 mg by mouth daily.    Marland Kitchen testosterone cypionate  (DEPOTESTOTERONE CYPIONATE) 200 MG/ML injection Inject 200 mg into the muscle every 7 (seven) days.     No current facility-administered medications on file prior to visit.     There are no Patient Instructions on file for this visit. No follow-ups on file.   Kris Hartmann, NP  This note was completed with Sales executive.  Any errors are purely unintentional.

## 2019-06-14 ENCOUNTER — Ambulatory Visit (INDEPENDENT_AMBULATORY_CARE_PROVIDER_SITE_OTHER): Payer: BC Managed Care – PPO | Admitting: Vascular Surgery

## 2019-06-14 ENCOUNTER — Other Ambulatory Visit: Payer: Self-pay

## 2019-06-14 ENCOUNTER — Encounter (INDEPENDENT_AMBULATORY_CARE_PROVIDER_SITE_OTHER): Payer: Self-pay | Admitting: Vascular Surgery

## 2019-06-14 DIAGNOSIS — I10 Essential (primary) hypertension: Secondary | ICD-10-CM | POA: Diagnosis not present

## 2019-06-14 DIAGNOSIS — L97211 Non-pressure chronic ulcer of right calf limited to breakdown of skin: Secondary | ICD-10-CM

## 2019-06-14 DIAGNOSIS — I890 Lymphedema, not elsewhere classified: Secondary | ICD-10-CM

## 2019-06-14 NOTE — Progress Notes (Signed)
MRN : [id number]  [name] Walker is a 43 y.o. (1975/11/29) male who presents with chief complaint of  Chief Complaint  Patient presents with  . Follow-up    1 month follow up  .  History of Present Illness: Patient returns today in follow up of his leg swelling and ulceration.  His right leg ulcers have healed.  He has 1 small open area on the medial left anterior lateral calf.  This is really not draining much.  His swelling has improved but certainly not resolved.  He is using the lymphedema pump but only once daily.  Current Outpatient Medications  Medication Sig Dispense Refill  . benazepril-hydrochlorthiazide (LOTENSIN HCT) 20-25 MG per tablet Take 2 tablets by mouth daily.    . furosemide (LASIX) 40 MG tablet TAKE 1 TABLET BY MOUTH ONCE DAILY AS NEEDED FOR EDEMA    . potassium chloride (K-DUR) 10 MEQ tablet Take by mouth.    . telmisartan (MICARDIS) 40 MG tablet Take 40 mg by mouth daily.    Marland Kitchen testosterone cypionate (DEPOTESTOTERONE CYPIONATE) 200 MG/ML injection Inject 200 mg into the muscle every 7 (seven) days.    Marland Kitchen amoxicillin-clavulanate (AUGMENTIN) 875-125 MG tablet Take 1 tablet by mouth 2 (two) times daily. (Patient not taking: Reported on 06/14/2019) 14 tablet 0  . ciprofloxacin (CIPRO) 500 MG tablet TAKE 1 TABLET BY MOUTH TWICE DAILY FOR 7 DAYS (TAKE THIS ALONG WITH LINEZOLID)    . linezolid (ZYVOX) 600 MG tablet Take 600 mg by mouth 2 (two) times daily. for 7 days     No current facility-administered medications for this visit.     Past Medical History:  Diagnosis Date  . Hypertension   . Sleep apnea     Past Surgical History:  Procedure Laterality Date  . HERNIA REPAIR      Social History Social History   Tobacco Use  . Smoking status: Never Smoker  . Smokeless tobacco: Never Used  Substance Use Topics  . Alcohol use: Yes    Alcohol/week: 24.0 standard drinks    Types: 24 Cans of beer per week  . Drug use: Not Currently     Family History Family  History  Problem Relation Age of Onset  . Heart disease Father   . Hypertension Father   . Arthritis Father      Allergies  Allergen Reactions  . Amoxicillin Other (See Comments)    Other reaction(s): Unknown  . Hydrochlorothiazide W-Triamterene     Other reaction(s): Other (See Comments), Unknown    REVIEW OF SYSTEMS (Negative unless checked)  Constitutional: [] ?Weight loss  [] ?Fever  [] ?Chills Cardiac: [] ?Chest pain   [] ?Chest pressure   [] ?Palpitations   [] ?Shortness of breath when laying flat   [] ?Shortness of breath at rest   [] ?Shortness of breath with exertion. Vascular:  [] ?Pain in legs with walking   [x] ?Pain in legs at rest   [] ?Pain in legs when laying flat   [] ?Claudication   [] ?Pain in feet when walking  [] ?Pain in feet at rest  [] ?Pain in feet when laying flat   [] ?History of DVT   [] ?Phlebitis   [x] ?Swelling in legs   [] ?Varicose veins   [x] ?Non-healing ulcers Pulmonary:   [] ?Uses home oxygen   [] ?Productive cough   [] ?Hemoptysis   [] ?Wheeze  [] ?COPD   [] ?Asthma Neurologic:  [] ?Dizziness  [] ?Blackouts   [] ?Seizures   [] ?History of stroke   [] ?History of TIA  [] ?Aphasia   [] ?Temporary blindness   [] ?Dysphagia   [] ?Weakness or  numbness in arms   [] ?Weakness or numbness in legs Musculoskeletal:  [x] ?Arthritis   [] ?Joint swelling   [x] ?Joint pain   [] ?Low back pain Hematologic:  [] ?Easy bruising  [] ?Easy bleeding   [] ?Hypercoagulable state   [] ?Anemic  [] ?Hepatitis Gastrointestinal:  [] ?Blood in stool   [] ?Vomiting blood  [] ?Gastroesophageal reflux/heartburn   [] ?Abdominal pain Genitourinary:  [] ?Chronic kidney disease   [] ?Difficult urination  [] ?Frequent urination  [] ?Burning with urination   [] ?Hematuria Skin:  [] ?Rashes   [x] ?Ulcers   [x] ?Wounds Psychological:  [] ?History of anxiety   [] ? History of major depression.  Physical Examination  BP (!) 165/82   Pulse 91   Resp 16   Wt (!) 437 lb (198.2 kg)   BMI 72.72 kg/m  Gen:  WD/WN, NAD.  Morbidly obese Head:  [place]/AT, No temporalis wasting. Ear/Nose/Throat: Hearing grossly intact, nares w/o erythema or drainage Eyes: Conjunctiva clear. Sclera non-icteric Neck: Supple.  Trachea midline Pulmonary:  Good air movement, no use of accessory muscles.  Cardiac: RRR, no JVD Vascular:  Vessel Right Left  Radial Palpable Palpable                   Musculoskeletal: M/S 5/5 throughout.  No deformity or atrophy.  2+ bilateral lower extremity edema. Neurologic: Sensation grossly intact in extremities.  Symmetrical.  Speech is fluent.  Psychiatric: Judgment intact, Mood & affect appropriate for pt's clinical situation. Dermatologic: Small wound on the anterior lateral left calf as described above       Labs No results found for this or any previous visit (from the past 2160 hour(s)).  Radiology No results found.  Assessment/Plan Essential hypertension blood pressure control important in reducing the progression of atherosclerotic disease. On appropriate oral medications.   Morbid obesity (HCC) This is clearly a major contributing factor to his lower extremity swelling and weight loss would be of huge importance in reducing the swelling.  Lymphedema At this point, at the patient's request we will try to come out of Unna boots and see how he does with compression for a few weeks.  If this wound enlarges or the drainage increases, we may have to consider going back into the Unna boot sooner.  He should wear compression stockings daily.  He should use his lymphedema pump once or twice daily if possible    , MD  06/14/2019 3:52 PM    This note was created with Dragon medical transcription system.  Any errors from dictation are purely unintentional

## 2019-06-14 NOTE — Assessment & Plan Note (Signed)
At this point, at the patient's request we will try to come out of Unna boots and see how he does with compression for a few weeks.  If this wound enlarges or the drainage increases, we may have to consider going back into the Unna boot sooner.  He should wear compression stockings daily.  He should use his lymphedema pump once or twice daily if possible

## 2019-07-05 ENCOUNTER — Ambulatory Visit (INDEPENDENT_AMBULATORY_CARE_PROVIDER_SITE_OTHER): Payer: BC Managed Care – PPO | Admitting: Nurse Practitioner

## 2019-07-07 ENCOUNTER — Other Ambulatory Visit: Payer: Self-pay

## 2019-07-07 ENCOUNTER — Ambulatory Visit (INDEPENDENT_AMBULATORY_CARE_PROVIDER_SITE_OTHER): Payer: BC Managed Care – PPO | Admitting: Nurse Practitioner

## 2019-07-07 ENCOUNTER — Encounter (INDEPENDENT_AMBULATORY_CARE_PROVIDER_SITE_OTHER): Payer: Self-pay | Admitting: Nurse Practitioner

## 2019-07-07 VITALS — BP 163/90 | HR 83 | Resp 16 | Wt >= 6400 oz

## 2019-07-07 DIAGNOSIS — L97211 Non-pressure chronic ulcer of right calf limited to breakdown of skin: Secondary | ICD-10-CM | POA: Diagnosis not present

## 2019-07-07 DIAGNOSIS — I890 Lymphedema, not elsewhere classified: Secondary | ICD-10-CM

## 2019-07-07 DIAGNOSIS — E785 Hyperlipidemia, unspecified: Secondary | ICD-10-CM | POA: Diagnosis not present

## 2019-07-08 ENCOUNTER — Encounter (INDEPENDENT_AMBULATORY_CARE_PROVIDER_SITE_OTHER): Payer: Self-pay | Admitting: Nurse Practitioner

## 2019-07-08 NOTE — Progress Notes (Signed)
SUBJECTIVE:  Patient ID: [name] Walker, male    DOB: 11-16-1975, 43 y.o.   MRN: [id number] Chief Complaint  Patient presents with  . Follow-up    HPI  [name] Walker is a 43 y.o. male that presents today for follow-up after being out of Unna wraps for the last several weeks.  Initially the patient had a ulceration as well as cellulitis on his lower extremities, however today the patient has no evidence of recurrent cellulitis and the previous wound is healed.  The patient has a lymphedema pump which he uses on a nightly basis for at least an hour.  He also endeavors to wear compression stockings on a daily basis.  He feels that the lymphedema pump has been a very positive factor in his care.  Overall he feels that he is doing much better.  He denies any fever, chills, nausea, vomiting or diarrhea.  Past Medical History:  Diagnosis Date  . Hypertension   . Sleep apnea     Past Surgical History:  Procedure Laterality Date  . HERNIA REPAIR      Social History   Socioeconomic History  . Marital status: Single    Spouse name: Not on file  . Number of children: Not on file  . Years of education: Not on file  . Highest education level: Not on file  Occupational History  . Not on file  Social Needs  . Financial resource strain: Not on file  . Food insecurity    Worry: Not on file    Inability: Not on file  . Transportation needs    Medical: Not on file    Non-medical: Not on file  Tobacco Use  . Smoking status: Never Smoker  . Smokeless tobacco: Never Used  Substance and Sexual Activity  . Alcohol use: Yes    Alcohol/week: 24.0 standard drinks    Types: 24 Cans of beer per week  . Drug use: Not Currently  . Sexual activity: Not on file  Lifestyle  . Physical activity    Days per week: Not on file    Minutes per session: Not on file  . Stress: Not on file  Relationships  . Social Musicianconnections    Talks on phone: Not on file    Gets together: Not on file    Attends  religious service: Not on file    Active member of club or organization: Not on file    Attends meetings of clubs or organizations: Not on file    Relationship status: Not on file  . Intimate partner violence    Fear of current or ex partner: Not on file    Emotionally abused: Not on file    Physically abused: Not on file    Forced sexual activity: Not on file  Other Topics Concern  . Not on file  Social History Narrative  . Not on file    Family History  Problem Relation Age of Onset  . Heart disease Father   . Hypertension Father   . Arthritis Father     Allergies  Allergen Reactions  . Amoxicillin Other (See Comments)    Other reaction(s): Unknown  . Hydrochlorothiazide W-Triamterene     Other reaction(s): Other (See Comments), Unknown     Review of Systems   Review of Systems: Negative Unless Checked Constitutional: [] Weight loss  [] Fever  [] Chills Cardiac: [] Chest pain   []  Atrial Fibrillation  [] Palpitations   [] Shortness of breath when laying flat   [] Shortness  of breath with exertion. [] Shortness of breath at rest Vascular:  [] Pain in legs with walking   [] Pain in legs with standing [] Pain in legs when laying flat   [] Claudication    [] Pain in feet when laying flat    [] History of DVT   [] Phlebitis   [x] Swelling in legs   [] Varicose veins   [] Non-healing ulcers Pulmonary:   [] Uses home oxygen   [] Productive cough   [] Hemoptysis   [] Wheeze  [] COPD   [] Asthma Neurologic:  [] Dizziness   [] Seizures  [] Blackouts [] History of stroke   [] History of TIA  [] Aphasia   [] Temporary Blindness   [] Weakness or numbness in arm   [] Weakness or numbness in leg Musculoskeletal:   [] Joint swelling   [] Joint pain   [] Low back pain  []  History of Knee Replacement [] Arthritis [] back Surgeries  []  Spinal Stenosis    Hematologic:  [] Easy bruising  [] Easy bleeding   [] Hypercoagulable state   [] Anemic Gastrointestinal:  [] Diarrhea   [] Vomiting  [] Gastroesophageal reflux/heartburn   [] Difficulty  swallowing. [] Abdominal pain Genitourinary:  [] Chronic kidney disease   [] Difficult urination  [] Anuric   [] Blood in urine [] Frequent urination  [] Burning with urination   [] Hematuria Skin:  [x] Rashes   [] Ulcers [] Wounds Psychological:  [] History of anxiety   []  History of major depression  []  Memory Difficulties      OBJECTIVE:   Physical Exam  BP (!) 163/90 (BP Location: Right Arm)   Pulse 83   Resp 16   Wt (!) 438 lb (198.7 kg)   BMI 72.89 kg/m   Gen: WD/WN, NAD Head: [place]/AT, No temporalis wasting.  Ear/Nose/Throat: Hearing grossly intact, nares w/o erythema or drainage Eyes: PER, EOMI, sclera nonicteric.  Neck: Supple, no masses.  No JVD.  Pulmonary:   Good air movement, no use of accessory muscles.  Cardiac: RRR Vascular:  2+ edema bilaterally Vessel Right Left  Radial Palpable Palpable  Brachial Palpable Palpable  Femoral Palpable Palpable  Popliteal Palpable Palpable  Dorsalis Pedis Palpable Palpable  Posterior Tibial Palpable Palpable   Gastrointestinal: soft, non-distended. No guarding/no peritoneal signs.  Musculoskeletal: M/S 5/5 throughout.  No deformity or atrophy.  Neurologic: Pain and light touch intact in extremities.  Symmetrical.  Speech is fluent. Motor exam as listed above. Psychiatric: Judgment intact, Mood & affect appropriate for pt's clinical situation. Dermatologic: Stasis dermatitis bilaterally, no ulceration.  No signs and symptoms of cellulitis  lymph : No Cervical lymphadenopathy, no lichenification, dermal thickening bilaterally       ASSESSMENT AND PLAN:  1. Lymphedema  No surgery or intervention at this point in time.    I have reviewed my discussion with the patient regarding lymphedema and why it  causes symptoms.  Patient will continue wearing graduated compression stockings class 1 (20-30 mmHg) on a daily basis a prescription was given. The patient is reminded to put the stockings on first thing in the morning and removing them in the  evening. The patient is instructed specifically not to sleep in the stockings.   In addition, behavioral modification throughout the day will be continued.  This will include frequent elevation (such as in a recliner), use of over the counter pain medications as needed and exercise such as walking.  I have reviewed systemic causes for chronic edema such as liver, kidney and cardiac etiologies and there does not appear to be any significant changes in these organ systems over the past year.  The patient is under the impression that these organ systems are all  stable and unchanged.    The patient will continue aggressive use of the  lymph pump.  This will continue to improve the edema control and prevent sequela such as ulcers and infections.   The patient will follow-up with me in 6 months basis.    2. Hyperlipidemia, unspecified hyperlipidemia type Continue statin as ordered and reviewed, no changes at this time   3. Lower limb ulcer, calf, right, limited to breakdown of skin (HCC) Resolved at this time   Current Outpatient Medications on File Prior to Visit  Medication Sig Dispense Refill  . benazepril-hydrochlorthiazide (LOTENSIN HCT) 20-25 MG per tablet Take 2 tablets by mouth daily.    . ciprofloxacin (CIPRO) 500 MG tablet TAKE 1 TABLET BY MOUTH TWICE DAILY FOR 7 DAYS (TAKE THIS ALONG WITH LINEZOLID)    . furosemide (LASIX) 40 MG tablet TAKE 1 TABLET BY MOUTH ONCE DAILY AS NEEDED FOR EDEMA    . linezolid (ZYVOX) 600 MG tablet Take 600 mg by mouth 2 (two) times daily. for 7 days    . potassium chloride (K-DUR) 10 MEQ tablet Take by mouth.    . telmisartan (MICARDIS) 40 MG tablet Take 40 mg by mouth daily.    Marland Kitchen testosterone cypionate (DEPOTESTOTERONE CYPIONATE) 200 MG/ML injection Inject 200 mg into the muscle every 7 (seven) days.    Marland Kitchen amoxicillin-clavulanate (AUGMENTIN) 875-125 MG tablet Take 1 tablet by mouth 2 (two) times daily. (Patient not taking: Reported on 06/14/2019) 14 tablet  0   No current facility-administered medications on file prior to visit.     There are no Patient Instructions on file for this visit. No follow-ups on file.   Georgiana Spinner, NP  This note was completed with Office manager.  Any errors are purely unintentional.

## 2019-09-13 ENCOUNTER — Ambulatory Visit (INDEPENDENT_AMBULATORY_CARE_PROVIDER_SITE_OTHER): Payer: BC Managed Care – PPO | Admitting: Vascular Surgery

## 2019-10-07 ENCOUNTER — Encounter (INDEPENDENT_AMBULATORY_CARE_PROVIDER_SITE_OTHER): Payer: Self-pay | Admitting: Vascular Surgery

## 2019-10-07 ENCOUNTER — Other Ambulatory Visit: Payer: Self-pay

## 2019-10-07 ENCOUNTER — Encounter (INDEPENDENT_AMBULATORY_CARE_PROVIDER_SITE_OTHER): Payer: Self-pay

## 2019-10-07 ENCOUNTER — Ambulatory Visit (INDEPENDENT_AMBULATORY_CARE_PROVIDER_SITE_OTHER): Payer: BC Managed Care – PPO | Admitting: Vascular Surgery

## 2019-10-07 VITALS — BP 198/75 | HR 103 | Resp 22 | Ht 66.0 in | Wt >= 6400 oz

## 2019-10-07 DIAGNOSIS — I890 Lymphedema, not elsewhere classified: Secondary | ICD-10-CM | POA: Diagnosis not present

## 2019-10-07 DIAGNOSIS — Z6841 Body Mass Index (BMI) 40.0 and over, adult: Secondary | ICD-10-CM | POA: Diagnosis not present

## 2019-10-07 DIAGNOSIS — I10 Essential (primary) hypertension: Secondary | ICD-10-CM

## 2019-10-07 NOTE — Progress Notes (Signed)
MRN : [id number]  [name] Walker is a 44 y.o. (05-09-1976) male who presents with chief complaint of  Chief Complaint  Patient presents with  . Follow-up  .  History of Present Illness: Patient returns today in follow up of his lymphedema and lower extremity swelling.  His leg swelling is fairly stable.  He is not wearing stockings but he is using the lymphedema pump every evening.  No new ulceration or infection.  No new complaints today  Current Outpatient Medications  Medication Sig Dispense Refill  . benazepril-hydrochlorthiazide (LOTENSIN HCT) 20-25 MG per tablet Take 2 tablets by mouth daily.    . furosemide (LASIX) 40 MG tablet TAKE 1 TABLET BY MOUTH ONCE DAILY AS NEEDED FOR EDEMA    . potassium chloride (K-DUR) 10 MEQ tablet Take by mouth.    . testosterone cypionate (DEPOTESTOTERONE CYPIONATE) 200 MG/ML injection Inject 200 mg into the muscle every 7 (seven) days.    Marland Kitchen amoxicillin-clavulanate (AUGMENTIN) 875-125 MG tablet Take 1 tablet by mouth 2 (two) times daily. (Patient not taking: Reported on 06/14/2019) 14 tablet 0  . ciprofloxacin (CIPRO) 500 MG tablet TAKE 1 TABLET BY MOUTH TWICE DAILY FOR 7 DAYS (TAKE THIS ALONG WITH LINEZOLID)    . linezolid (ZYVOX) 600 MG tablet Take 600 mg by mouth 2 (two) times daily. for 7 days    . telmisartan (MICARDIS) 40 MG tablet Take 40 mg by mouth daily.     No current facility-administered medications for this visit.    Past Medical History:  Diagnosis Date  . Hypertension   . Sleep apnea     Past Surgical History:  Procedure Laterality Date  . HERNIA REPAIR       Social History   Tobacco Use  . Smoking status: Never Smoker  . Smokeless tobacco: Never Used  Substance Use Topics  . Alcohol use: Yes    Alcohol/week: 24.0 standard drinks    Types: 24 Cans of beer per week  . Drug use: Not Currently    Family History  Problem Relation Age of Onset  . Heart disease Father   . Hypertension Father   . Arthritis Father       Allergies  Allergen Reactions  . Amoxicillin Other (See Comments)    Other reaction(s): Unknown  . Hydrochlorothiazide W-Triamterene     Other reaction(s): Other (See Comments), Unknown     REVIEW OF SYSTEMS (Negative unless checked)  Constitutional: [] Weight loss  [] Fever  [] Chills Cardiac: [] Chest pain   [] Chest pressure   [] Palpitations   [] Shortness of breath when laying flat   [] Shortness of breath at rest   [] Shortness of breath with exertion. Vascular:  [] Pain in legs with walking   [] Pain in legs at rest   [] Pain in legs when laying flat   [] Claudication   [] Pain in feet when walking  [] Pain in feet at rest  [] Pain in feet when laying flat   [] History of DVT   [] Phlebitis   [x] Swelling in legs   [] Varicose veins   [] Non-healing ulcers Pulmonary:   [] Uses home oxygen   [] Productive cough   [] Hemoptysis   [] Wheeze  [] COPD   [] Asthma Neurologic:  [] Dizziness  [] Blackouts   [] Seizures   [] History of stroke   [] History of TIA  [] Aphasia   [] Temporary blindness   [] Dysphagia   [] Weakness or numbness in arms   [] Weakness or numbness in legs Musculoskeletal:  [] Arthritis   [] Joint swelling   [] Joint pain   [] Low back  pain Hematologic:  [] Easy bruising  [] Easy bleeding   [] Hypercoagulable state   [] Anemic   Gastrointestinal:  [] Blood in stool   [] Vomiting blood  [] Gastroesophageal reflux/heartburn   [] Abdominal pain Genitourinary:  [] Chronic kidney disease   [] Difficult urination  [] Frequent urination  [] Burning with urination   [] Hematuria Skin:  [x] Rashes   [] Ulcers   [] Wounds Psychological:  [] History of anxiety   []  History of major depression.  Physical Examination  BP (!) 198/75 (BP Location: Right Arm)   Pulse (!) 103   Resp (!) 22   Ht 5\' 6"  (1.676 m)   Wt (!) 440 lb (199.6 kg)   BMI 71.02 kg/m  Gen:  WD/WN, NAD.  Morbidly obese Head: [place]/AT, No temporalis wasting. Ear/Nose/Throat: Hearing grossly intact, nares w/o erythema or drainage Eyes: Conjunctiva clear. Sclera  non-icteric Neck: Supple.  Trachea midline Pulmonary:  Good air movement, no use of accessory muscles.  Cardiac: RRR, no JVD Vascular:  Vessel Right Left  Radial Palpable Palpable                          PT Palpable Palpable  DP Palpable Palpable    Musculoskeletal: M/S 5/5 throughout.  No deformity or atrophy.  1+ bilateral lower extremity edema. Neurologic: Sensation grossly intact in extremities.  Symmetrical.  Speech is fluent.  Psychiatric: Judgment intact, Mood & affect appropriate for pt's clinical situation. Dermatologic: No rashes or ulcers noted.  No cellulitis or open wounds.       Labs No results found for this or any previous visit (from the past 2160 hour(s)).  Radiology No results found.  Assessment/Plan Essential hypertension blood pressure control important in reducing the progression of atherosclerotic disease. On appropriate oral medications.   Morbid obesity ([place]) This is clearly a major contributing factor to his lower extremity swelling and weight loss would be of huge importance in reducing the swelling.  Lymphedema Symptoms are reasonably well controlled with the daily use of a lymphedema pump.  He is not currently wearing stockings and I told him if swelling worsens, he should get back in the stockings.  He can also use the pump twice daily.  At this point, we will stretch out his follow-up and see him in 6 months unless problems develop in the interim    [name], MD  10/07/2019 12:15 PM    This note was created with Dragon medical transcription system.  Any errors from dictation are purely unintentional

## 2019-10-07 NOTE — Assessment & Plan Note (Signed)
Symptoms are reasonably well controlled with the daily use of a lymphedema pump.  He is not currently wearing stockings and I told him if swelling worsens, he should get back in the stockings.  He can also use the pump twice daily.  At this point, we will stretch out his follow-up and see him in 6 months unless problems develop in the interim

## 2020-01-05 ENCOUNTER — Ambulatory Visit (INDEPENDENT_AMBULATORY_CARE_PROVIDER_SITE_OTHER): Payer: BC Managed Care – PPO | Admitting: Vascular Surgery

## 2020-03-02 ENCOUNTER — Other Ambulatory Visit: Payer: Self-pay

## 2020-03-02 ENCOUNTER — Encounter: Payer: Self-pay | Admitting: Urology

## 2020-03-02 ENCOUNTER — Ambulatory Visit: Payer: BC Managed Care – PPO | Admitting: Urology

## 2020-03-02 VITALS — BP 140/84 | HR 80 | Ht 66.0 in | Wt >= 6400 oz

## 2020-03-02 DIAGNOSIS — E291 Testicular hypofunction: Secondary | ICD-10-CM

## 2020-03-02 NOTE — Progress Notes (Signed)
03/02/2020 10:54 AM   [name] Walker 1976/02/07 [id number]  Referring provider: Adin Hector, MD [facility],  [place] [address]  Chief Complaint  Patient presents with   Hypogonadism    HPI: [name] Walker is a 44 YO male seen at the request of Dr. Caryl Comes for evaluation and management of hypogonadism.  -Long history of hypogonadism who I previously saw in 2014 and had on TRT -I last saw him in 2016 -States he has been off replacement for "a few years" -Complains tiredness, fatigue -Recent T levels 60 (01/04/2020), 63 (03/02/2019), 70 (01/14/2019) -PSA 12/2019 0.11 -No bothersome LUTS -Morbidly obese and had problems with intramuscular injections and not able to successfully inject   PMH: Past Medical History:  Diagnosis Date   Hypertension    Sleep apnea     Surgical History: Past Surgical History:  Procedure Laterality Date   HERNIA REPAIR      Home Medications:  Allergies as of 03/02/2020      Reactions   Amoxicillin Other (See Comments)   Other reaction(s): Unknown   Hydrochlorothiazide W-triamterene    Other reaction(s): Other (See Comments), Unknown      Medication List       Accurate as of March 02, 2020 10:54 AM. If you have any questions, ask your nurse or doctor.        STOP taking these medications   amoxicillin-clavulanate 875-125 MG tablet Commonly known as: Augmentin Stopped by: Abbie Sons, MD   ciprofloxacin 500 MG tablet Commonly known as: CIPRO Stopped by: Abbie Sons, MD   testosterone cypionate 200 MG/ML injection Commonly known as: DEPOTESTOSTERONE CYPIONATE Stopped by: Abbie Sons, MD     TAKE these medications   benazepril-hydrochlorthiazide 20-25 MG tablet Commonly known as: LOTENSIN HCT Take 2 tablets by mouth daily.   furosemide 40 MG tablet Commonly known as: LASIX TAKE 1 TABLET BY MOUTH ONCE DAILY AS NEEDED FOR EDEMA   linezolid 600 MG tablet Commonly known as:  ZYVOX Take 600 mg by mouth 2 (two) times daily. for 7 days   potassium chloride 10 MEQ tablet Commonly known as: KLOR-CON Take by mouth.   telmisartan 40 MG tablet Commonly known as: MICARDIS Take 40 mg by mouth daily.       Allergies:  Allergies  Allergen Reactions   Amoxicillin Other (See Comments)    Other reaction(s): Unknown   Hydrochlorothiazide W-Triamterene     Other reaction(s): Other (See Comments), Unknown    Family History: Family History  Problem Relation Age of Onset   Heart disease Father    Hypertension Father    Arthritis Father     Social History:  reports that he has never smoked. He has never used smokeless tobacco. He reports current alcohol use of about 24.0 standard drinks of alcohol per week. He reports previous drug use.   Physical Exam: There were no vitals taken for this visit.  Constitutional:  Alert and oriented, No acute distress. HEENT: [place] AT, moist mucus membranes.  Trachea midline, no masses. Cardiovascular: No clubbing, cyanosis, or edema. Respiratory: Normal respiratory effort, no increased work of breathing. GI: Abdomen morbidly obese, soft, nontender, nondistended, no abdominal masses Skin: No rashes, bruises or suspicious lesions. Neurologic: Grossly intact, no focal deficits, moving all 4 extremities. Psychiatric: Normal mood and affect.   Assessment & Plan:    1.  Hypogonadism -Testosterone levels near castrate -Unable to successfully perform intramuscular injections -Other options discussed including  Testopel and Xyosted.  He was given literature on each -Recheck LH/prolactin -Potential side effects of testosterone replacement were discussed including stimulation of benign prostatic growth with lower urinary tract symptoms; erythrocytosis; edema; gynecomastia; worsening sleep apnea; venous thromboembolism; testicular atrophy and infertility. Recent studies suggesting an increased incidence of heart attack and stroke in  patients taking testosterone was discussed. He was informed there is conflicting evidence regarding the impact of testosterone therapy on cardiovascular risk. The theoretical risk of growth stimulation of an undetected prostate cancer was also discussed.  He was informed that current evidence does not provide any definitive answers regarding the risks of testosterone therapy on prostate cancer and cardiovascular disease. The need for periodic monitoring of his testosterone level, PSA, hematocrit and DRE was discussed.   Riki Altes, MD  [facility] [address] [place], [place] [address] [phone]

## 2020-03-03 LAB — PROLACTIN: Prolactin: 9.9 ng/mL (ref 4.0–15.2)

## 2020-03-03 LAB — LUTEINIZING HORMONE: LH: 0.9 m[IU]/mL — ABNORMAL LOW (ref 1.7–8.6)

## 2020-03-04 ENCOUNTER — Other Ambulatory Visit: Payer: Self-pay | Admitting: Urology

## 2020-03-04 DIAGNOSIS — E230 Hypopituitarism: Secondary | ICD-10-CM

## 2020-03-05 ENCOUNTER — Encounter: Payer: Self-pay | Admitting: Urology

## 2020-03-05 ENCOUNTER — Telehealth: Payer: Self-pay | Admitting: *Deleted

## 2020-03-05 NOTE — Telephone Encounter (Signed)
-----   Message from Riki Altes, MD sent at 03/04/2020  9:10 AM EDT ----- LH level was very low.  Recommend scheduling pituitary MRI.  Order was entered

## 2020-03-05 NOTE — Telephone Encounter (Signed)
Notified patient as instructed, patient pleased. Discussed follow-up appointments, patient agrees  

## 2020-04-06 ENCOUNTER — Encounter (INDEPENDENT_AMBULATORY_CARE_PROVIDER_SITE_OTHER): Payer: Self-pay | Admitting: Vascular Surgery

## 2020-04-06 ENCOUNTER — Other Ambulatory Visit: Payer: Self-pay

## 2020-04-06 ENCOUNTER — Ambulatory Visit (INDEPENDENT_AMBULATORY_CARE_PROVIDER_SITE_OTHER): Payer: BC Managed Care – PPO | Admitting: Vascular Surgery

## 2020-04-06 VITALS — BP 162/75 | HR 83 | Resp 16 | Wt >= 6400 oz

## 2020-04-06 DIAGNOSIS — I10 Essential (primary) hypertension: Secondary | ICD-10-CM

## 2020-04-06 DIAGNOSIS — I890 Lymphedema, not elsewhere classified: Secondary | ICD-10-CM | POA: Diagnosis not present

## 2020-04-06 NOTE — Assessment & Plan Note (Signed)
His lymphedema symptoms are currently fairly stable.  Certainly, compression stockings would help reduce the swelling but if he cannot get these on consistent use the lymphedema pump is absolutely essential.  The patient will continue to use this and elevate his legs as tolerated.  I am going to stretch him out to an annual follow-up unless worsening symptoms develop.

## 2020-04-06 NOTE — Progress Notes (Signed)
MRN : [id number]  [name] Walker is a 44 y.o. (1976-08-15) male who presents with chief complaint of  Chief Complaint  Patient presents with  . Follow-up    34month follow up  .  History of Present Illness: Patient returns today in follow up of his leg swelling and lymphedema.  His legs are about the same as they were 6 months ago.  No significant improvement or progression of his symptoms.  He cannot really get compression stockings on and off.  He is trying to elevate his legs.  He uses the lymphedema pump at least once a day.  That has really kept his symptoms relatively stable.  Current Outpatient Medications  Medication Sig Dispense Refill  . furosemide (LASIX) 40 MG tablet TAKE 1 TABLET BY MOUTH ONCE DAILY AS NEEDED FOR EDEMA    . metoprolol succinate (TOPROL-XL) 50 MG 24 hr tablet Take by mouth.    . potassium chloride (K-DUR) 10 MEQ tablet Take by mouth.    . telmisartan (MICARDIS) 40 MG tablet Take 40 mg by mouth daily.    . benazepril-hydrochlorthiazide (LOTENSIN HCT) 20-25 MG per tablet Take 2 tablets by mouth daily. (Patient not taking: Reported on 04/06/2020)    . linezolid (ZYVOX) 600 MG tablet Take 600 mg by mouth 2 (two) times daily. for 7 days (Patient not taking: Reported on 04/06/2020)     No current facility-administered medications for this visit.    Past Medical History:  Diagnosis Date  . Hypertension   . Sleep apnea     Past Surgical History:  Procedure Laterality Date  . HERNIA REPAIR       Social History   Tobacco Use  . Smoking status: Never Smoker  . Smokeless tobacco: Never Used  Substance Use Topics  . Alcohol use: Yes    Alcohol/week: 24.0 standard drinks    Types: 24 Cans of beer per week  . Drug use: Not Currently       Family History  Problem Relation Age of Onset  . Heart disease Father   . Hypertension Father   . Arthritis Father      Allergies  Allergen Reactions  . Amoxicillin Other (See Comments)    Other reaction(s):  Unknown  . Hydrochlorothiazide W-Triamterene     Other reaction(s): Other (See Comments), Unknown     REVIEW OF SYSTEMS (Negative unless checked)  Constitutional: [] Weight loss  [] Fever  [] Chills Cardiac: [] Chest pain   [] Chest pressure   [] Palpitations   [] Shortness of breath when laying flat   [] Shortness of breath at rest   [] Shortness of breath with exertion. Vascular:  [] Pain in legs with walking   [] Pain in legs at rest   [] Pain in legs when laying flat   [] Claudication   [] Pain in feet when walking  [] Pain in feet at rest  [] Pain in feet when laying flat   [] History of DVT   [] Phlebitis   [x] Swelling in legs   [] Varicose veins   [] Non-healing ulcers Pulmonary:   [] Uses home oxygen   [] Productive cough   [] Hemoptysis   [] Wheeze  [] COPD   [] Asthma Neurologic:  [] Dizziness  [] Blackouts   [] Seizures   [] History of stroke   [] History of TIA  [] Aphasia   [] Temporary blindness   [] Dysphagia   [] Weakness or numbness in arms   [] Weakness or numbness in legs Musculoskeletal:  [x] Arthritis   [] Joint swelling   [] Joint pain   [] Low back pain Hematologic:  [] Easy bruising  [] Easy bleeding   []   Hypercoagulable state   [] Anemic   Gastrointestinal:  [] Blood in stool   [] Vomiting blood  [] Gastroesophageal reflux/heartburn   [] Abdominal pain Genitourinary:  [] Chronic kidney disease   [] Difficult urination  [] Frequent urination  [] Burning with urination   [] Hematuria Skin:  [x] Rashes   [] Ulcers   [] Wounds Psychological:  [] History of anxiety   []  History of major depression.  Physical Examination  BP (!) 162/75 (BP Location: Right Arm)   Pulse 83   Resp 16   Wt (!) 448 lb 9.6 oz (203.5 kg)   BMI 72.41 kg/m  Gen:  WD/WN, NAD. Morbidly obese Head: /AT, No temporalis wasting. Ear/Nose/Throat: Hearing grossly intact, nares w/o erythema or drainage Eyes: Conjunctiva clear. Sclera non-icteric Neck: Supple.  Trachea midline Pulmonary:  Good air movement, no use of accessory muscles.  Cardiac: RRR, no  JVD Vascular:  Vessel Right Left  Radial Palpable Palpable                          PT Palpable Palpable  DP Palpable Palpable    Musculoskeletal: M/S 5/5 throughout.  No deformity or atrophy.  1-2+ bilateral lower extremity edema. Neurologic: Sensation grossly intact in extremities.  Symmetrical.  Speech is fluent.  Psychiatric: Judgment intact, Mood & affect appropriate for pt's clinical situation. Dermatologic: No rashes or ulcers noted.  No cellulitis or open wounds.       Labs Recent Results (from the past 2160 hour(s))  Prolactin     Status: None   Collection Time: 03/02/20 11:34 AM  Result Value Ref Range   Prolactin 9.9 4.0 - 15.2 ng/mL  Luteinizing hormone     Status: Abnormal   Collection Time: 03/02/20 11:34 AM  Result Value Ref Range   LH 0.9 (L) 1.7 - 8.6 mIU/mL    Radiology No results found.  Assessment/Plan Essential hypertension blood pressure control important in reducing the progression of atherosclerotic disease. On appropriate oral medications.   Morbid obesity ([facility]) This is clearly a major contributing factor to his lower extremity swelling and weight loss would be of huge importance in reducing the swelling.  Lymphedema His lymphedema symptoms are currently fairly stable.  Certainly, compression stockings would help reduce the swelling but if he cannot get these on consistent use the lymphedema pump is absolutely essential.  The patient will continue to use this and elevate his legs as tolerated.  I am going to stretch him out to an annual follow-up unless worsening symptoms develop.    , MD  04/06/2020 12:10 PM    This note was created with Dragon medical transcription system.  Any errors from dictation are purely unintentional

## 2020-07-16 ENCOUNTER — Ambulatory Visit (INDEPENDENT_AMBULATORY_CARE_PROVIDER_SITE_OTHER): Payer: BC Managed Care – PPO | Admitting: Internal Medicine

## 2020-07-16 DIAGNOSIS — G4733 Obstructive sleep apnea (adult) (pediatric): Secondary | ICD-10-CM | POA: Diagnosis not present

## 2020-07-16 DIAGNOSIS — Z9989 Dependence on other enabling machines and devices: Secondary | ICD-10-CM

## 2020-07-16 DIAGNOSIS — Z7189 Other specified counseling: Secondary | ICD-10-CM | POA: Diagnosis not present

## 2020-07-16 DIAGNOSIS — Z8639 Personal history of other endocrine, nutritional and metabolic disease: Secondary | ICD-10-CM

## 2020-07-16 NOTE — Patient Instructions (Signed)

## 2020-07-16 NOTE — Progress Notes (Signed)
[facility] [address] [place], [place] [address]  Pulmonary Sleep Medicine   Office Visit Note  Patient Name: [name] Walker DOB: [birth year] MRN [id number]    Chief Complaint: Obstructive Sleep Apnea visit  Brief History:  [name] Walker Is seen today for follow up he has a 16 year history of sleep apnea. His using CPAP nightly. He recently received a replacement CPAP. He is sleeping well with CPAP. He goes to bed between 8-10 p.m. and gets up at 4 a.m. for work. He feels rested when he wakes. He sleeps in on the weekends. He feels that he benefits from CPAP. His Epworth Sleepiness Score is 1 out of 24. He reports only rare dry mouth despite not using the humidifier.  He denies RLS symptoms. The compliance has been excellent with and average use of 9 hours and the AHI is 4.2. This was due to about 6 months of very high mask leak and high AHI. ROS  General: (-) fever, (-) chills, (-) night sweat Nose and Sinuses: (-) nasal stuffiness or itchiness, (-) postnasal drip, (-) nosebleeds, (-) sinus trouble. Mouth and Throat: (-) sore throat, (-) hoarseness. Neck: (-) swollen glands, (-) enlarged thyroid, (-) neck pain. Respiratory: - cough, - shortness of breath, - wheezing. Neurologic: - numbness, - tingling. Psychiatric: - anxiety, - depression   Current Medication: Outpatient Encounter Medications as of 07/16/2020  Medication Sig  . furosemide (LASIX) 40 MG tablet TAKE 1 TABLET BY MOUTH ONCE DAILY AS NEEDED FOR EDEMA  . metoprolol succinate (TOPROL-XL) 50 MG 24 hr tablet Take by mouth.  . potassium chloride (K-DUR) 10 MEQ tablet Take by mouth.  . telmisartan (MICARDIS) 40 MG tablet Take 40 mg by mouth daily.  . [DISCONTINUED] benazepril-hydrochlorthiazide (LOTENSIN HCT) 20-25 MG per tablet Take 2 tablets by mouth daily. (Patient not taking: Reported on 04/06/2020)  . [DISCONTINUED] linezolid (ZYVOX) 600 MG tablet Take 600 mg by mouth 2 (two) times daily. for 7 days (Patient not  taking: Reported on 04/06/2020)   No facility-administered encounter medications on file as of 07/16/2020.    Surgical History: Past Surgical History:  Procedure Laterality Date  . HERNIA REPAIR    . KNEE SURGERY      Medical History: Past Medical History:  Diagnosis Date  . Hypertension   . Sleep apnea     Family History: Non contributory to the present illness  Social History: Social History   Socioeconomic History  . Marital status: Single    Spouse name: Not on file  . Number of children: Not on file  . Years of education: Not on file  . Highest education level: Not on file  Occupational History  . Not on file  Tobacco Use  . Smoking status: Never Smoker  . Smokeless tobacco: Never Used  Substance and Sexual Activity  . Alcohol use: Yes    Alcohol/week: 24.0 standard drinks    Types: 24 Cans of beer per week  . Drug use: Not Currently  . Sexual activity: Not on file  Other Topics Concern  . Not on file  Social History Narrative  . Not on file   Social Determinants of Health   Financial Resource Strain:   . Difficulty of Paying Living Expenses: Not on file  Food Insecurity:   . Worried About Programme researcher, broadcasting/film/video in the Last Year: Not on file  . Ran Out of Food in the Last Year: Not on file  Transportation Needs:   . Lack of Transportation (  Medical): Not on file  . Lack of Transportation (Non-Medical): Not on file  Physical Activity:   . Days of Exercise per Week: Not on file  . Minutes of Exercise per Session: Not on file  Stress:   . Feeling of Stress : Not on file  Social Connections:   . Frequency of Communication with Friends and Family: Not on file  . Frequency of Social Gatherings with Friends and Family: Not on file  . Attends Religious Services: Not on file  . Active Member of Clubs or Organizations: Not on file  . Attends Banker Meetings: Not on file  . Marital Status: Not on file  Intimate Partner Violence:   . Fear of  Current or Ex-Partner: Not on file  . Emotionally Abused: Not on file  . Physically Abused: Not on file  . Sexually Abused: Not on file    Vital Signs: Blood pressure (!) 137/95, pulse 73, height 5\' 6"  (1.676 m), weight (!) 438 lb (198.7 kg), SpO2 97 %.  Examination: General Appearance: The patient is well-developed, well-nourished, and in no distress. Neck Circumference: 49 Skin: Gross inspection of skin unremarkable. Head: normocephalic, no gross deformities. Eyes: no gross deformities noted. ENT: ears appear grossly normal Neurologic: Alert and oriented. No involuntary movements.    EPWORTH SLEEPINESS SCALE:  Scale:  (0)= no chance of dozing; (1)= slight chance of dozing; (2)= moderate chance of dozing; (3)= high chance of dozing  Chance  Situtation    Sitting and reading: 0    Watching TV: 0    Sitting Inactive in public: 0    As a passenger in car: 0      Lying down to rest: 1    Sitting and talking: 0    Sitting quielty after lunch: 0    In a car, stopped in traffic: 0   TOTAL SCORE:   1 out of 24    SLEEP STUDIES:  PSG 05/20/19 AHI16 SpO12min81% original not on file  CPAP COMPLIANCE DATA:  Date Range: 07/14/19-07/12/20  Average Daily Use: 9 hours  Median Use: 8.8  Compliance for > 4 Hours: 99%  AHI: 4.2 respiratory events per hour  Days Used: 363/365  Mask Leak: 72  95th Percentile Pressure: 20/14  LABS: No results found for this or any previous visit (from the past 2160 hour(s)).  Radiology: No results found.  No results found.  No results found.    Assessment and Plan: Patient Active Problem List   Diagnosis Date Noted  . History of hypothyroidism 05/11/2019  . Hyperlipidemia 05/11/2019  . Sleep apnea 05/11/2019  . Lymphedema 03/10/2019  . Hypertension 03/08/2019  . Morbid obesity (HCC) 03/08/2019  . Lower limb ulcer, calf, right, limited to breakdown of skin (HCC) 03/08/2019  . Swelling of limb 03/08/2019  .  Polycythemia 01/26/2015  . Cellulitis 04/12/2013  . Morbid obesity with BMI of 40.0-44.9, adult (HCC) 04/12/2013  . Foreign body anus/rectum 04/05/2013  . Encounter for long-term (current) use of medications 11/30/2012  . Testicular hypofunction 11/30/2012      The patient does tolerate the BiPAP and reports significant benefit from use.  He is caring for his machine as recommended.  The compliance has been excellent with an average use time of 9 hours. The AHI is 4.2. The mask leak and consequently the AHI was very elevated for approximately 6 months. Once he began replacing his mask, the leak and apneas improved markedly.  1. OSA- continue excellent compliance. Replace supplies regularly.  2. CPAP couseling-Discussed importance of adequate CPAP use as well as proper care and cleaning techniques of machine and all supplies. 3. Morbid obesity: Discussed the importance of weight management through healthy eating and daily exercise as tolerated. Discussed the negative effects obesity has on pulmonary health, cardiac health as well as overall general health and well being. 4. History of hypothyroidism-Per his report not requiring medication therapy at this time, thyroid labs have remained stable  General Counseling: I have discussed the findings of the evaluation and examination with Laroy.  I have also discussed any further diagnostic evaluation thatmay be needed or ordered today. Ajdin verbalizes understanding of the findings of todays visit. We also reviewed his medications today and discussed drug interactions and side effects including but not limited excessive drowsiness and altered mental states. We also discussed that there is always a risk not just to him but also people around him. he has been encouraged to call the office with any questions or concerns that should arise related to todays visit.   I have personally obtained a history, examined the patient, evaluated laboratory and imaging  results, formulated the assessment and plan and placed orders.  This patient was seen by Theotis Burrow, AGNP-C in collaboration with Dr. Freda Munro as a part of collaborative care agreement.  Valentino Hue Sol Blazing, PhD, FAASM  Diplomate, American Board of Sleep Medicine    Yevonne Pax, MD [facility] Diplomate ABMS Pulmonary and Critical Care Medicine Sleep medicine

## 2020-11-29 DIAGNOSIS — E538 Deficiency of other specified B group vitamins: Secondary | ICD-10-CM | POA: Insufficient documentation

## 2021-03-28 ENCOUNTER — Telehealth: Payer: Self-pay

## 2021-03-28 NOTE — Telephone Encounter (Signed)
Left vm for patient to return my call to scheduled earlier appt-Toni

## 2021-04-05 ENCOUNTER — Ambulatory Visit (INDEPENDENT_AMBULATORY_CARE_PROVIDER_SITE_OTHER): Payer: BC Managed Care – PPO | Admitting: Vascular Surgery

## 2021-04-05 ENCOUNTER — Other Ambulatory Visit: Payer: Self-pay

## 2021-04-05 ENCOUNTER — Encounter (INDEPENDENT_AMBULATORY_CARE_PROVIDER_SITE_OTHER): Payer: Self-pay | Admitting: Vascular Surgery

## 2021-04-05 VITALS — BP 181/74 | HR 76 | Resp 16 | Wt >= 6400 oz

## 2021-04-05 DIAGNOSIS — I10 Essential (primary) hypertension: Secondary | ICD-10-CM

## 2021-04-05 DIAGNOSIS — I890 Lymphedema, not elsewhere classified: Secondary | ICD-10-CM

## 2021-04-05 NOTE — Assessment & Plan Note (Signed)
Patient's lymphedema is reasonably stable.  Lymphedema pump helps, but without compression this is not really as helpful as it needs to be.  I given him a prescription for the juxta lite Velcro compression system to try that.  We will plan to see him back in 1 year.

## 2021-04-05 NOTE — Progress Notes (Signed)
MRN : [id number]  [name] Walker is a 45 y.o. ([birth year]) male who presents with chief complaint of  Chief Complaint  Patient presents with   Follow-up    1 yr follow up  .  History of Present Illness: Patient returns today in follow up of his leg swelling.  His legs are about the same.  He has been using the lymphedema pump regularly.  He has not really been wearing stockings as he has tried to get regular stockings and zip up stockings on and this is very difficult given the size of his legs.  No ulceration or infection.  He has had occasional weeping or small scabs but these have generally healed up quickly.  Current Outpatient Medications  Medication Sig Dispense Refill   furosemide (LASIX) 40 MG tablet 40 mg as needed.     gabapentin (NEURONTIN) 100 MG capsule Take 100 mg by mouth 2 (two) times daily.     hydrALAZINE (APRESOLINE) 25 MG tablet Take 1 tablet by mouth 2 (two) times daily.     metoprolol succinate (TOPROL-XL) 50 MG 24 hr tablet Take by mouth.     potassium chloride (K-DUR) 10 MEQ tablet Take by mouth.     valsartan (DIOVAN) 320 MG tablet Take 320 mg by mouth daily.     telmisartan (MICARDIS) 40 MG tablet Take 40 mg by mouth daily. (Patient not taking: Reported on 04/05/2021)     No current facility-administered medications for this visit.    Past Medical History:  Diagnosis Date   Hypertension    Sleep apnea     Past Surgical History:  Procedure Laterality Date   HERNIA REPAIR     KNEE SURGERY       Social History   Tobacco Use   Smoking status: Never   Smokeless tobacco: Never  Substance Use Topics   Alcohol use: Yes    Alcohol/week: 24.0 standard drinks    Types: 24 Cans of beer per week   Drug use: Not Currently       Family History  Problem Relation Age of Onset   Heart disease Father    Hypertension Father    Arthritis Father      No Known Allergies  REVIEW OF SYSTEMS (Negative unless checked)   Constitutional: [] Weight loss   [] Fever  [] Chills Cardiac: [] Chest pain   [] Chest pressure   [] Palpitations   [] Shortness of breath when laying flat   [] Shortness of breath at rest   [] Shortness of breath with exertion. Vascular:  [] Pain in legs with walking   [] Pain in legs at rest   [] Pain in legs when laying flat   [] Claudication   [] Pain in feet when walking  [] Pain in feet at rest  [] Pain in feet when laying flat   [] History of DVT   [] Phlebitis   [x] Swelling in legs   [] Varicose veins   [] Non-healing ulcers Pulmonary:   [] Uses home oxygen   [] Productive cough   [] Hemoptysis   [] Wheeze  [] COPD   [] Asthma Neurologic:  [] Dizziness  [] Blackouts   [] Seizures   [] History of stroke   [] History of TIA  [] Aphasia   [] Temporary blindness   [] Dysphagia   [] Weakness or numbness in arms   [] Weakness or numbness in legs Musculoskeletal:  [x] Arthritis   [] Joint swelling   [] Joint pain   [] Low back pain Hematologic:  [] Easy bruising  [] Easy bleeding   [] Hypercoagulable state   [] Anemic   Gastrointestinal:  [] Blood in stool   [] Vomiting  blood  [] Gastroesophageal reflux/heartburn   [] Abdominal pain Genitourinary:  [] Chronic kidney disease   [] Difficult urination  [] Frequent urination  [] Burning with urination   [] Hematuria Skin:  [x] Rashes   [] Ulcers   [] Wounds Psychological:  [] History of anxiety   []  History of major depression.   Physical Examination  BP (!) 181/74 (BP Location: Right Arm)   Pulse 76   Resp 16   Wt (!) 442 lb (200.5 kg)   BMI 71.34 kg/m  Gen:  WD/WN, NAD Head: [place]/AT, No temporalis wasting. Ear/Nose/Throat: Hearing grossly intact, nares w/o erythema or drainage Eyes: Conjunctiva clear. Sclera non-icteric Neck: Supple.  Trachea midline Pulmonary:  Good air movement, no use of accessory muscles.  Cardiac: RRR, no JVD Vascular:  Vessel Right Left  Radial Palpable Palpable           Musculoskeletal: M/S 5/5 throughout.  No deformity or atrophy.  1-2+ bilateral lower extremity edema.  Moderate stasis dermatitis  changes present bilaterally Neurologic: Sensation grossly intact in extremities.  Symmetrical.  Speech is fluent.  Psychiatric: Judgment intact, Mood & affect appropriate for pt's clinical situation. Dermatologic: No rashes or ulcers noted.  No cellulitis or open wounds.      Labs No results found for this or any previous visit (from the past 2160 hour(s)).  Radiology No results found.  Assessment/Plan Essential hypertension blood pressure control important in reducing the progression of atherosclerotic disease. On appropriate oral medications.     Morbid obesity (HCC) This is clearly a major contributing factor to his lower extremity swelling and weight loss would be of huge importance in reducing the swelling.  Lymphedema Patient's lymphedema is reasonably stable.  Lymphedema pump helps, but without compression this is not really as helpful as it needs to be.  I given him a prescription for the juxta lite Velcro compression system to try that.  We will plan to see him back in 1 year.    , MD  04/05/2021 11:44 AM    This note was created with Dragon medical transcription system.  Any errors from dictation are purely unintentional

## 2021-04-09 ENCOUNTER — Ambulatory Visit: Payer: BC Managed Care – PPO | Admitting: Internal Medicine

## 2021-04-09 ENCOUNTER — Encounter: Payer: Self-pay | Admitting: Internal Medicine

## 2021-04-09 ENCOUNTER — Other Ambulatory Visit: Payer: Self-pay

## 2021-04-09 VITALS — BP 154/80 | HR 70 | Temp 97.1°F | Resp 16 | Ht 66.0 in | Wt >= 6400 oz

## 2021-04-09 DIAGNOSIS — G4733 Obstructive sleep apnea (adult) (pediatric): Secondary | ICD-10-CM

## 2021-04-09 DIAGNOSIS — E662 Morbid (severe) obesity with alveolar hypoventilation: Secondary | ICD-10-CM

## 2021-04-09 DIAGNOSIS — J849 Interstitial pulmonary disease, unspecified: Secondary | ICD-10-CM

## 2021-04-09 DIAGNOSIS — Z77090 Contact with and (suspected) exposure to asbestos: Secondary | ICD-10-CM

## 2021-04-09 DIAGNOSIS — R0602 Shortness of breath: Secondary | ICD-10-CM | POA: Diagnosis not present

## 2021-04-09 DIAGNOSIS — Z9989 Dependence on other enabling machines and devices: Secondary | ICD-10-CM

## 2021-04-09 DIAGNOSIS — Z7189 Other specified counseling: Secondary | ICD-10-CM | POA: Diagnosis not present

## 2021-04-09 NOTE — Progress Notes (Signed)
[facility] [address] [place], [place] [address]  Pulmonary Sleep Medicine   Office Visit Note  Patient Name: [name] Walker DOB: 09/24/1975 MRN [id number]  Date of Service: 04/09/2021  Complaints/HPI: Hypoxia. Apparently he had a pulsox done which showed his O2 sats to be low. He was placed on oxygen and states that it seems to have helped. Also has a history of OSA and is on CPAP therapy. He states that he had a follow up recently for the sleep study last year. Patient states the sleep issues were severe. He recently had his pressure increased and was changed over to BiPAP. He has never smoked but has been around smoker. 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 He was exposed to significant asbestos. 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 Eyes: (-) visual changes, (-) redness, (-) itching. Nose and Sinuses: (-) nasal stuffiness or itchiness, (-) postnasal drip, (-) nosebleeds, (-) sinus trouble. Mouth and Throat: (-) sore throat, (-) hoarseness. Neck: (-) swollen glands, (-) enlarged thyroid, (-) neck pain. Respiratory: + cough, (-) bloody sputum, + shortness of breath, - wheezing. Cardiovascular: + ankle swelling, (-) chest pain. Lymphatic: (-) lymph node enlargement. Neurologic: (-) numbness, (-) tingling. Psychiatric: (-) anxiety, (-) depression   Current Medication: Outpatient Encounter Medications as of 04/09/2021  Medication Sig   furosemide (LASIX) 40 MG tablet 40 mg as needed.   gabapentin (NEURONTIN) 100 MG capsule Take 100 mg by mouth 2 (two) times daily.   hydrALAZINE (APRESOLINE) 25 MG tablet Take 1 tablet by mouth 2 (two) times daily.   OXYGEN Inhale into the lungs.   valsartan (DIOVAN) 320 MG  tablet Take 320 mg by mouth daily.   metoprolol succinate (TOPROL-XL) 50 MG 24 hr tablet Take by mouth.   potassium chloride (K-DUR) 10 MEQ tablet Take by mouth.   [DISCONTINUED] telmisartan (MICARDIS) 40 MG tablet Take 40 mg by mouth daily. (Patient not taking: Reported on 04/05/2021)   No facility-administered encounter medications on file as of 04/09/2021.    Surgical History: Past Surgical History:  Procedure Laterality Date   HERNIA REPAIR     KNEE SURGERY      Medical History: Past Medical History:  Diagnosis Date   Hypertension    Hypoxia    Sleep apnea     Family History: Family History  Problem Relation Age of Onset   Alzheimer's disease Mother    Heart disease Father    Hypertension Father    Arthritis Father    Cancer Paternal Grandmother     Social History: Social History   Socioeconomic History   Marital status: Single    Spouse name: Not on file   Number of children: Not on file   Years of education: Not on file   Highest education level: Not on file  Occupational History   Not on file  Tobacco Use   Smoking status: Never   Smokeless tobacco: Never  Substance and Sexual Activity   Alcohol use: Yes    Alcohol/week: 24.0 standard drinks    Types: 24 Cans of beer per week   Drug use: Not Currently   Sexual activity: Not on file  Other Topics Concern   Not  on file  Social History Narrative   Not on file   Social Determinants of Health   Financial Resource Strain: Not on file  Food Insecurity: Not on file  Transportation Needs: Not on file  Physical Activity: Not on file  Stress: Not on file  Social Connections: Not on file  Intimate Partner Violence: Not on file    Vital Signs: Blood pressure (!) 154/80, pulse 70, temperature (!) 97.1 F (36.2 C), resp. rate 16, height 5\' 6"  (1.676 m), weight (!) 450 lb 12.8 oz (204.5 kg), SpO2 97 %.  Examination: General Appearance: The patient is well-developed, well-nourished, and in no  distress. Skin: Gross inspection of skin unremarkable. Head: normocephalic, no gross deformities. Eyes: no gross deformities noted. ENT: ears appear grossly normal no exudates. Neck: Supple. No thyromegaly. No LAD. Respiratory: few rhonchi noted. Cardiovascular: Normal S1 and S2 without murmur or rub. Extremities: No cyanosis. pulses are equal. Neurologic: Alert and oriented. No involuntary movements.  LABS: No results found for this or any previous visit (from the past 2160 hour(s)).  Radiology: No results found.  No results found.  No results found.    Assessment and Plan: Patient Active Problem List   Diagnosis Date Noted   B12 deficiency 11/29/2020   History of hypothyroidism 05/11/2019   Hyperlipidemia 05/11/2019   Sleep apnea 05/11/2019   Lymphedema 03/10/2019   Hypertension 03/08/2019   Morbid obesity (HCC) 03/08/2019   Lower limb ulcer, calf, right, limited to breakdown of skin (HCC) 03/08/2019   Swelling of limb 03/08/2019   Polycythemia 01/26/2015   Cellulitis 04/12/2013   Morbid obesity with BMI of 40.0-44.9, adult (HCC) 04/12/2013   Foreign body anus/rectum 04/05/2013   Encounter for long-term (current) use of medications 11/30/2012   Hypogonadism in male 11/30/2012   1. OSA on CPAP Patient has a very strong history of obstructive sleep apnea unclear if the current pressures are adequate to control his sleep disordered breathing.  Part of the problem that he is having may very well be related to uncontrolled sleep apnea.  I also suspect there may be a degree of obesity hypoventilation symptoms going on also.  With a longstanding history of OSA he is also got a chance of having underlying pulmonary hypertension so therefore an echocardiogram would be of help  2. CPAP use counseling CPAP Counseling: had a lengthy discussion with the patient regarding the importance of PAP therapy in management of the sleep apnea. Patient appears to understand the risk factor  reduction and also understands the risks associated with untreated sleep apnea. Patient will try to make a good faith effort to remain compliant with therapy. Also instructed the patient on proper cleaning of the device including the water must be changed daily if possible and use of distilled water is preferred. Patient understands that the machine should be regularly cleaned with appropriate recommended cleaning solutions that do not damage the PAP machine for example given white vinegar and water rinses. Other methods such as ozone treatment may not be as good as these simple methods to achieve cleaning.   3. Morbid obesity (HCC) Obesity Counseling: Had a lengthy discussion regarding patients BMI and weight issues. Patient was instructed on portion control as well as increased activity. Also discussed caloric restrictions with trying to maintain intake less than 2000 Kcal. Discussions were made in accordance with the 5As of weight management. Simple actions such as not eating late and if able to, taking a walk is suggested.   4. SOB (shortness  of breath)  - ECHOCARDIOGRAM COMPLETE; Future - Pulmonary function test; Future  5. Obesity hypoventilation syndrome (HCC) I suspect there is an element of obesity hypoventilation which is playing a role in this case.  He needs to be compliant with his CPAP therapy BiPAP therapy as prescribed  6. 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  Back at that time patient states that they did not wear any kind of protection so I think this needs to be worked up I think he needs to have CT scan done for evaluation if there are any pleural plaques or any interstitial lung disease may very well also explain his hypoxia  7. Interstitial pulmonary disease (HCC) Possible interstitial lung disease to be considered because of exposure  to asbestos as well as exposure to silica - CT Chest High Resolution; Future    General Counseling: I have discussed the findings of the evaluation and examination with Zacharey.  I have also discussed any further diagnostic evaluation thatmay be needed or ordered today. Maor verbalizes understanding of the findings of todays visit. We also reviewed his medications today and discussed drug interactions and side effects including but not limited excessive drowsiness and altered mental states. We also discussed that there is always a risk not just to him but also people around him. he has been encouraged to call the office with any questions or concerns that should arise related to todays visit.  No orders of the defined types were placed in this encounter.    Time spent: 37  I have personally obtained a history, examined the patient, evaluated laboratory and imaging results, formulated the assessment and plan and placed orders.    Yevonne Pax, MD [facility] Pulmonary and Critical Care Sleep medicine

## 2021-04-10 ENCOUNTER — Telehealth: Payer: Self-pay

## 2021-04-10 NOTE — Telephone Encounter (Signed)
Emailed updated work note to patient. Called patient to let him know-Toni

## 2021-04-17 ENCOUNTER — Telehealth: Payer: Self-pay

## 2021-04-17 NOTE — Telephone Encounter (Signed)
FMLA paperwork given to Dr.Fozia Welton Flakes to complete.

## 2021-04-25 ENCOUNTER — Telehealth: Payer: Self-pay

## 2021-04-25 NOTE — Telephone Encounter (Signed)
FMLA paperwork completed by provider and patient advised ready at the front desk. Copy placed in scan.

## 2021-05-01 ENCOUNTER — Other Ambulatory Visit: Payer: Self-pay

## 2021-05-01 ENCOUNTER — Ambulatory Visit: Payer: BC Managed Care – PPO

## 2021-05-01 DIAGNOSIS — R0602 Shortness of breath: Secondary | ICD-10-CM

## 2021-05-06 ENCOUNTER — Telehealth: Payer: Self-pay

## 2021-05-06 NOTE — Telephone Encounter (Signed)
Left vm to confirm 05/08/21 PFT-Toni

## 2021-05-07 ENCOUNTER — Ambulatory Visit
Admission: RE | Admit: 2021-05-07 | Discharge: 2021-05-07 | Disposition: A | Discharge status: Home / Self Care | Payer: BC Managed Care – PPO | Source: Ambulatory Visit | Attending: Internal Medicine | Admitting: Internal Medicine

## 2021-05-07 ENCOUNTER — Ambulatory Visit: Payer: BC Managed Care – PPO | Admitting: Internal Medicine

## 2021-05-07 ENCOUNTER — Other Ambulatory Visit: Payer: Self-pay

## 2021-05-07 DIAGNOSIS — J849 Interstitial pulmonary disease, unspecified: Secondary | ICD-10-CM | POA: Diagnosis present

## 2021-05-08 ENCOUNTER — Ambulatory Visit: Payer: BC Managed Care – PPO | Admitting: Internal Medicine

## 2021-05-08 DIAGNOSIS — R0602 Shortness of breath: Secondary | ICD-10-CM

## 2021-05-08 LAB — PULMONARY FUNCTION TEST

## 2021-05-15 NOTE — Procedures (Signed)
[facility] [address] [place], [address]    Complete Pulmonary Function Testing Interpretation:  FINDINGS:  The forced vital capacity is mildly decreased.  FEV1 is 2.39 L which is 68% of predicted and is mildly decreased.  FEV1 FVC ratio is mildly decreased.  Postbronchodilator there is no significant change in the FEV1 clinical improvement may still occur in the absence of spirometric improvement.  Total lung capacity is mildly decreased.  Residual volume is decreased.  Residual volume total lung capacity ratio is decreased.  FRC is decreased.  DLCO is increased.  IMPRESSION:  This pulmonary function study is consistent with mild obstructive and mild restrictive pulmonary disease  Yevonne Pax, MD [facility] Pulmonary Critical Care Medicine Sleep Medicine

## 2021-05-21 ENCOUNTER — Ambulatory Visit: Payer: BC Managed Care – PPO | Admitting: Internal Medicine

## 2021-05-21 ENCOUNTER — Encounter: Payer: Self-pay | Admitting: Internal Medicine

## 2021-05-21 ENCOUNTER — Other Ambulatory Visit: Payer: Self-pay

## 2021-05-21 DIAGNOSIS — R0602 Shortness of breath: Secondary | ICD-10-CM

## 2021-05-21 DIAGNOSIS — I2721 Secondary pulmonary arterial hypertension: Secondary | ICD-10-CM | POA: Diagnosis not present

## 2021-05-21 DIAGNOSIS — J449 Chronic obstructive pulmonary disease, unspecified: Secondary | ICD-10-CM | POA: Diagnosis not present

## 2021-05-21 DIAGNOSIS — G4733 Obstructive sleep apnea (adult) (pediatric): Secondary | ICD-10-CM | POA: Diagnosis not present

## 2021-05-21 DIAGNOSIS — Z7189 Other specified counseling: Secondary | ICD-10-CM

## 2021-05-21 DIAGNOSIS — E662 Morbid (severe) obesity with alveolar hypoventilation: Secondary | ICD-10-CM

## 2021-05-21 DIAGNOSIS — Z77090 Contact with and (suspected) exposure to asbestos: Secondary | ICD-10-CM

## 2021-05-21 NOTE — Progress Notes (Signed)
[name] Walker [address] [place], [place] [address]  Pulmonary Sleep Medicine   Office Visit Note  Patient Name: [name] Walker DOB: June 17, 1976 MRN [id number]  Date of Service: 05/21/2021  Complaints/HPI: OSA Obesity PAH COPD. He is on BiPAP device now he states he he does feel bettr. He is using oxygen. He is currently on 2lpm with good saturations.  The patient had sleep study done I reviewed the results of the sleep study.  He is on the BiPAP as already noted this does significantly help him.  Still has significant shortness of breath when he walks around.  He had an echocardiogram done he had his echocardiogram showing presence of pulmonary arterial hypertension which likely contributing to his shortness of breath.  In addition he had pulmonary functions done which shows presence of COPD.  Based on the findings of the COPD pulmonary hypertension super morbid obesity obstructive sleep apnea and oxygen dependence he will likely need to work on getting his disability as he is going to require oxygen consistently  ROS  General: (-) fever, (-) chills, (-) night sweats, (-) weakness Skin: (-) rashes, (-) itching,. Eyes: (-) visual changes, (-) redness, (-) itching. Nose and Sinuses: (-) nasal stuffiness or itchiness, (-) postnasal drip, (-) nosebleeds, (-) sinus trouble. Mouth and Throat: (-) sore throat, (-) hoarseness. Neck: (-) swollen glands, (-) enlarged thyroid, (-) neck pain. Respiratory: - cough, (-) bloody sputum, + shortness of breath, - wheezing. Cardiovascular: + ankle swelling, (-) chest pain. Lymphatic: (-) lymph node enlargement. Neurologic: (-) numbness, (-) tingling. Psychiatric: (-) anxiety, (-) depression   Current Medication: Outpatient Encounter Medications as of 05/21/2021  Medication Sig   furosemide (LASIX) 40 MG tablet 40 mg as needed.   gabapentin (NEURONTIN) 100 MG capsule Take 100 mg by mouth 2 (two) times daily.   hydrALAZINE (APRESOLINE) 25 MG  tablet Take 1 tablet by mouth 2 (two) times daily.   metoprolol succinate (TOPROL-XL) 50 MG 24 hr tablet Take by mouth.   OXYGEN Inhale into the lungs.   potassium chloride (K-DUR) 10 MEQ tablet Take by mouth.   valsartan (DIOVAN) 320 MG tablet Take 320 mg by mouth daily.   No facility-administered encounter medications on file as of 05/21/2021.    Surgical History: Past Surgical History:  Procedure Laterality Date   HERNIA REPAIR     KNEE SURGERY      Medical History: Past Medical History:  Diagnosis Date   Hypertension    Hypoxia    Sleep apnea     Family History: Family History  Problem Relation Age of Onset   Alzheimer's disease Mother    Heart disease Father    Hypertension Father    Arthritis Father    Cancer Paternal Grandmother     Social History: Social History   Socioeconomic History   Marital status: Single    Spouse name: Not on file   Number of children: Not on file   Years of education: Not on file   Highest education level: Not on file  Occupational History   Not on file  Tobacco Use   Smoking status: Never   Smokeless tobacco: Never  Substance and Sexual Activity   Alcohol use: Yes    Alcohol/week: 24.0 standard drinks    Types: 24 Cans of beer per week    Comment: per week   Drug use: Not Currently   Sexual activity: Not on file  Other Topics Concern   Not on file  Social History  Narrative   Not on file   Social Determinants of Health   Financial Resource Strain: Not on file  Food Insecurity: Not on file  Transportation Needs: Not on file  Physical Activity: Not on file  Stress: Not on file  Social Connections: Not on file  Intimate Partner Violence: Not on file    Vital Signs: Pulse 100, temperature 98.1 F (36.7 C), resp. rate 16, height 5\' 6"  (1.676 m), weight (!) 449 lb 12.8 oz (204 kg), SpO2 94 %.  Examination: General Appearance: The patient is well-developed, well-nourished, and in no distress. Skin: Gross inspection  of skin unremarkable. Head: normocephalic, no gross deformities. Eyes: no gross deformities noted. ENT: ears appear grossly normal no exudates. Neck: Supple. No thyromegaly. No LAD. Respiratory: no rhonchi noted. Cardiovascular: Normal S1 and S2 without murmur or rub. Extremities: No cyanosis. pulses are equal. Neurologic: Alert and oriented. No involuntary movements.  LABS: No results found for this or any previous visit (from the past 2160 hour(s)).  Radiology: CT Chest High Resolution  Result Date: 05/08/2021 CLINICAL DATA:  Interstitial lung disease EXAM: CT CHEST WITHOUT CONTRAST TECHNIQUE: Multidetector CT imaging of the chest was performed following the standard protocol without intravenous contrast. High resolution imaging of the lungs, as well as inspiratory and expiratory imaging, was performed. COMPARISON:  None. FINDINGS: Cardiovascular: No significant vascular findings. Mild cardiomegaly. Left coronary artery calcifications. No pericardial effusion. Enlargement of the main pulmonary artery measuring up to 3.8 cm. Mediastinum/Nodes: No enlarged mediastinal, hilar, or axillary lymph nodes. Thyroid gland, trachea, and esophagus demonstrate no significant findings. Lungs/Pleura: Lungs are clear. No evidence of fibrotic interstitial lung disease. No significant air trapping on expiratory phase imaging. No pleural effusion or pneumothorax. Upper Abdomen: No acute abnormality.  Hepatic steatosis. Musculoskeletal: No chest wall mass or suspicious bone lesions identified. IMPRESSION: 1. No evidence of fibrotic interstitial lung disease. 2. Mild cardiomegaly and coronary artery disease. 3. Enlargement of the main pulmonary artery, as can be seen in pulmonary hypertension. 4. Hepatic steatosis. Electronically Signed   By: 05/10/2021 M.D.   On: 05/08/2021 10:45    No results found.  CT Chest High Resolution  Result Date: 05/08/2021 CLINICAL DATA:  Interstitial lung disease EXAM: CT CHEST  WITHOUT CONTRAST TECHNIQUE: Multidetector CT imaging of the chest was performed following the standard protocol without intravenous contrast. High resolution imaging of the lungs, as well as inspiratory and expiratory imaging, was performed. COMPARISON:  None. FINDINGS: Cardiovascular: No significant vascular findings. Mild cardiomegaly. Left coronary artery calcifications. No pericardial effusion. Enlargement of the main pulmonary artery measuring up to 3.8 cm. Mediastinum/Nodes: No enlarged mediastinal, hilar, or axillary lymph nodes. Thyroid gland, trachea, and esophagus demonstrate no significant findings. Lungs/Pleura: Lungs are clear. No evidence of fibrotic interstitial lung disease. No significant air trapping on expiratory phase imaging. No pleural effusion or pneumothorax. Upper Abdomen: No acute abnormality.  Hepatic steatosis. Musculoskeletal: No chest wall mass or suspicious bone lesions identified. IMPRESSION: 1. No evidence of fibrotic interstitial lung disease. 2. Mild cardiomegaly and coronary artery disease. 3. Enlargement of the main pulmonary artery, as can be seen in pulmonary hypertension. 4. Hepatic steatosis. Electronically Signed   By: 05/10/2021 M.D.   On: 05/08/2021 10:45      Assessment and Plan: Patient Active Problem List   Diagnosis Date Noted   B12 deficiency 11/29/2020   History of hypothyroidism 05/11/2019   Hyperlipidemia 05/11/2019   Sleep apnea 05/11/2019   Lymphedema 03/10/2019   Hypertension 03/08/2019  Morbid obesity (HCC) 03/08/2019   Lower limb ulcer, calf, right, limited to breakdown of skin (HCC) 03/08/2019   Swelling of limb 03/08/2019   Polycythemia 01/26/2015   Cellulitis 04/12/2013   Morbid obesity with BMI of 40.0-44.9, adult (HCC) 04/12/2013   Foreign body anus/rectum 04/05/2013   Encounter for long-term (current) use of medications 11/30/2012   Hypogonadism in male 11/30/2012    1. OSA (obstructive sleep apnea) Needs to continue with  positive airway pressure therapy.  Patient has been using and states that he is actually feeling better.  He does need to work on diet exercise and weight loss  2. Morbid obesity (HCC) Obesity Counseling: Had a lengthy discussion regarding patients BMI and weight issues. Patient was instructed on portion control as well as increased activity. Also discussed caloric restrictions with trying to maintain intake less than 2000 Kcal. Discussions were made in accordance with the 5As of weight management. Simple actions such as not eating late and if able to, taking a walk is suggested.   3. CPAP use counseling CPAP Counseling: had a lengthy discussion with the patient regarding the importance of PAP therapy in management of the sleep apnea. Patient appears to understand the risk factor reduction and also understands the risks associated with untreated sleep apnea. Patient will try to make a good faith effort to remain compliant with therapy. Also instructed the patient on proper cleaning of the device including the water must be changed daily if possible and use of distilled water is preferred. Patient understands that the machine should be regularly cleaned with appropriate recommended cleaning solutions that do not damage the PAP machine for example given white vinegar and water rinses. Other methods such as ozone treatment may not be as good as these simple methods to achieve cleaning.   4. Pulmonary arterial hypertension (HCC) Likely secondary to chronic hypoxia as well as obstructive sleep apnea.  Would recommend at this stage to continue with the PAP therapy as well as the oxygen therapy.  5. Chronic obstructive pulmonary disease, unspecified COPD type (HCC) Significant pulmonary disease as noted by this pulmonary function test his FEV1 was 68% predicted however because of the morbid obesity because of the pulmonary hypertension and oxygen dependence patient significant disability when it comes to  breathing and shortness of breath  6. Asbestos exposure He has exposure to asbestos however the CT scan of the chest that was done did not show evidence of fibrotic interstitial lung disease and did not see any pleural plaques etc. so this would go against a diagnosis of asbestosis  General Counseling: I have discussed the findings of the evaluation and examination with Jatniel.  I have also discussed any further diagnostic evaluation thatmay be needed or ordered today. Lochlin verbalizes understanding of the findings of todays visit. We also reviewed his medications today and discussed drug interactions and side effects including but not limited excessive drowsiness and altered mental states. We also discussed that there is always a risk not just to him but also people around him. he has been encouraged to call the office with any questions or concerns that should arise related to todays visit.  No orders of the defined types were placed in this encounter.    Time spent: 85  I have personally obtained a history, examined the patient, evaluated laboratory and imaging results, formulated the assessment and plan and placed orders.    Yevonne Pax, MD [facility] Pulmonary and Critical Care Sleep medicine

## 2021-05-22 ENCOUNTER — Other Ambulatory Visit: Payer: Self-pay

## 2021-05-22 DIAGNOSIS — R0602 Shortness of breath: Secondary | ICD-10-CM

## 2021-05-22 NOTE — Progress Notes (Signed)
Pt given a work note at check out for pt to be out of work for 2 months

## 2021-05-23 ENCOUNTER — Telehealth: Payer: Self-pay

## 2021-05-23 NOTE — Telephone Encounter (Signed)
Disability paper work for patient placed on Dr. Dalphine Handing desk.

## 2021-06-03 ENCOUNTER — Telehealth: Payer: Self-pay

## 2021-06-03 NOTE — Telephone Encounter (Signed)
Patients long term disability paperwork given to dsk to complete.

## 2021-06-04 ENCOUNTER — Encounter: Payer: Self-pay | Admitting: Internal Medicine

## 2021-06-19 ENCOUNTER — Telehealth: Payer: Self-pay

## 2021-06-19 NOTE — Telephone Encounter (Signed)
Patient called to check status of disability paperwork that was given to dsk on 06/03/21. I told patient we will follow up with dsk tomorrow, then give him a call-Toni

## 2021-06-20 NOTE — Telephone Encounter (Signed)
Spoke with dsk. He is still reviewing paperwork.

## 2021-06-27 ENCOUNTER — Telehealth: Payer: Self-pay

## 2021-06-27 NOTE — Telephone Encounter (Signed)
Spoke with patiently earlier this afternoon in regards to his long term disability paperwork. He was instructed to come in around 3:00 tomorrow afternoon to fill out his portion of the disability paperwork. Dr. Beverely Risen also placed an order for an ABG on Oxygen for patient to [facility] prior to him filling out this paperwork. Patient also needs to advise office if he prefers to see [facility] or Dr.Arida for cardiology. I left a vm asking patient to call us back.

## 2021-06-27 NOTE — Addendum Note (Signed)
Addended by: Lyndon Code on: 06/27/2021 04:10 PM   Modules accepted: Orders

## 2021-06-27 NOTE — Telephone Encounter (Signed)
Patient called again regarding disability paperwork. I sent text to dsk for update-Toni

## 2021-06-28 ENCOUNTER — Other Ambulatory Visit
Admission: RE | Admit: 2021-06-28 | Discharge: 2021-06-28 | Disposition: A | Discharge status: Home / Self Care | Payer: BC Managed Care – PPO | Attending: Internal Medicine | Admitting: Internal Medicine

## 2021-06-28 DIAGNOSIS — R0602 Shortness of breath: Secondary | ICD-10-CM | POA: Insufficient documentation

## 2021-06-28 DIAGNOSIS — G4733 Obstructive sleep apnea (adult) (pediatric): Secondary | ICD-10-CM | POA: Insufficient documentation

## 2021-06-28 DIAGNOSIS — I272 Pulmonary hypertension, unspecified: Secondary | ICD-10-CM | POA: Insufficient documentation

## 2021-06-28 DIAGNOSIS — R0600 Dyspnea, unspecified: Secondary | ICD-10-CM | POA: Diagnosis not present

## 2021-06-28 LAB — BLOOD GAS, ARTERIAL
Acid-Base Excess: 2.3 mmol/L — ABNORMAL HIGH (ref 0.0–2.0)
Bicarbonate: 26.5 mmol/L (ref 20.0–28.0)
FIO2: 0.32
O2 Saturation: 97.9 %
Patient temperature: 37
pCO2 arterial: 39 mmHg (ref 32.0–48.0)
pH, Arterial: 7.44 (ref 7.350–7.450)
pO2, Arterial: 98 mmHg (ref 83.0–108.0)

## 2021-07-01 ENCOUNTER — Telehealth: Payer: Self-pay

## 2021-07-01 NOTE — Telephone Encounter (Signed)
Attending physician's statement completed and signed by provider for disability paperwork and copy placed in scan. Patient will pickup once his portion of the paperwork is completed.

## 2021-07-02 ENCOUNTER — Other Ambulatory Visit: Payer: Self-pay

## 2021-07-02 ENCOUNTER — Ambulatory Visit: Payer: BC Managed Care – PPO | Admitting: Internal Medicine

## 2021-07-02 VITALS — BP 159/82 | HR 104 | Temp 97.9°F | Resp 16 | Ht 66.0 in | Wt >= 6400 oz

## 2021-07-02 DIAGNOSIS — E662 Morbid (severe) obesity with alveolar hypoventilation: Secondary | ICD-10-CM

## 2021-07-02 DIAGNOSIS — R0602 Shortness of breath: Secondary | ICD-10-CM | POA: Diagnosis not present

## 2021-07-02 DIAGNOSIS — G4733 Obstructive sleep apnea (adult) (pediatric): Secondary | ICD-10-CM

## 2021-07-02 DIAGNOSIS — I2721 Secondary pulmonary arterial hypertension: Secondary | ICD-10-CM

## 2021-07-02 NOTE — Progress Notes (Signed)
[facility] [address] [place], [place] [address]  Pulmonary Sleep Medicine   Office Visit Note  Patient Name: [name] Walker DOB: 1976-06-04 MRN [id number]  Date of Service: 07/02/2021  Complaints/HPI: Patient is back for follow-up.  The patient still has significant shortness of breath still has significant hypoxia.  He is on oxygen therapy.  The patient had echocardiogram done here showing a normal EF however right-sided pressures were not visualized.  Went back to 2020 where he had another echocardiogram done and at that time the ejection fraction was 60 to 65% left ventricular diastolic parameters were within normal limits the right ventricle was normal systolic function and cavity size is also normal.  This would suggest that there is probably no increase in right-sided pressures.  This study was still somewhat suboptimal because of his body size.  But the right-sided pressures were still fine and in fact the right sided atrial pressures were estimated to be about and pulmonary pressures were not noted to be elevated. ROS  General: (-) fever, (-) chills, (-) night sweats, (-) weakness Skin: (-) rashes, (-) itching,. Eyes: (-) visual changes, (-) redness, (-) itching. Nose and Sinuses: (-) nasal stuffiness or itchiness, (-) postnasal drip, (-) nosebleeds, (-) sinus trouble. Mouth and Throat: (-) sore throat, (-) hoarseness. Neck: (-) swollen glands, (-) enlarged thyroid, (-) neck pain. Respiratory: - cough, (-) bloody sputum, + shortness of breath, - wheezing. Cardiovascular: + ankle swelling, (-) chest pain. Lymphatic: (-) lymph node enlargement. Neurologic: (-) numbness, (-) tingling. Psychiatric: (-) anxiety, (-) depression   Current Medication: Outpatient Encounter Medications as of 07/02/2021  Medication Sig   furosemide (LASIX) 40 MG tablet 40 mg as needed.   gabapentin (NEURONTIN) 100 MG capsule Take 100 mg by mouth 2 (two) times daily.   hydrALAZINE  (APRESOLINE) 25 MG tablet Take 1 tablet by mouth 2 (two) times daily.   OXYGEN Inhale into the lungs.   valsartan (DIOVAN) 320 MG tablet Take 320 mg by mouth daily.   metoprolol succinate (TOPROL-XL) 50 MG 24 hr tablet Take by mouth.   potassium chloride (K-DUR) 10 MEQ tablet Take by mouth.   No facility-administered encounter medications on file as of 07/02/2021.    Surgical History: Past Surgical History:  Procedure Laterality Date   HERNIA REPAIR     KNEE SURGERY      Medical History: Past Medical History:  Diagnosis Date   Hypertension    Hypoxia    Sleep apnea     Family History: Family History  Problem Relation Age of Onset   Alzheimer's disease Mother    Heart disease Father    Hypertension Father    Arthritis Father    Cancer Paternal Grandmother     Social History: Social History   Socioeconomic History   Marital status: Single    Spouse name: Not on file   Number of children: Not on file   Years of education: Not on file   Highest education level: Not on file  Occupational History   Not on file  Tobacco Use   Smoking status: Never   Smokeless tobacco: Never  Substance and Sexual Activity   Alcohol use: Yes    Alcohol/week: 24.0 standard drinks    Types: 24 Cans of beer per week    Comment: per week   Drug use: Not Currently   Sexual activity: Not on file  Other Topics Concern   Not on file  Social History Narrative   Not  on file   Social Determinants of Health   Financial Resource Strain: Not on file  Food Insecurity: Not on file  Transportation Needs: Not on file  Physical Activity: Not on file  Stress: Not on file  Social Connections: Not on file  Intimate Partner Violence: Not on file    Vital Signs: Blood pressure (!) 159/82, pulse (!) 104, temperature 97.9 F (36.6 C), resp. rate 16, height 5\' 6"  (1.676 m), weight (!) 444 lb (201.4 kg), SpO2 97 %.  Examination: General Appearance: The patient is well-developed, well-nourished,  and in no distress. Skin: Gross inspection of skin unremarkable. Head: normocephalic, no gross deformities. Eyes: no gross deformities noted. ENT: ears appear grossly normal no exudates. Neck: Supple. No thyromegaly. No LAD. Respiratory: few rhonchi noted. Cardiovascular: Normal S1 and S2 without murmur or rub. Extremities: No cyanosis. pulses are equal. Neurologic: Alert and oriented. No involuntary movements.  LABS: Recent Results (from the past 2160 hour(s))  Blood gas, arterial     Status: Abnormal   Collection Time: 06/28/21  3:40 PM  Result Value Ref Range   FIO2 0.32    Delivery systems NASAL CANNULA    pH, Arterial 7.44 7.350 - 7.450   pCO2 arterial 39 32.0 - 48.0 mmHg   pO2, Arterial 98 83.0 - 108.0 mmHg   Bicarbonate 26.5 20.0 - 28.0 mmol/L   Acid-Base Excess 2.3 (H) 0.0 - 2.0 mmol/L   O2 Saturation 97.9 %   Patient temperature 37.0    Collection site RIGHT RADIAL    Sample type ARTERIAL DRAW     Comment: Performed at [facility], [address], [place], [place]    Radiology: No results found.  No results found.  No results found.    Assessment and Plan: Patient Active Problem List   Diagnosis Date Noted   B12 deficiency 11/29/2020   History of hypothyroidism 05/11/2019   Hyperlipidemia 05/11/2019   Sleep apnea 05/11/2019   Lymphedema 03/10/2019   Hypertension 03/08/2019   Morbid obesity (HCC) 03/08/2019   Lower limb ulcer, calf, right, limited to breakdown of skin (HCC) 03/08/2019   Swelling of limb 03/08/2019   Polycythemia 01/26/2015   Cellulitis 04/12/2013   Morbid obesity with BMI of 40.0-44.9, adult (HCC) 04/12/2013   Foreign body anus/rectum 04/05/2013   Encounter for long-term (current) use of medications 11/30/2012   Hypogonadism in male 11/30/2012    1. SOB (shortness of breath) He does have SOB likely due to obesity and MILD COPD. He did have an echo in 2020 which did not reveal PAH and most recent echo the RV was  not able to be visulaized. Would get a stress test and refer to cardiology for this.  2. OSA (obstructive sleep apnea) He is on PAP therapy last [facility] done at [facility] will need the report.  3. Morbid obesity (HCC) Obesity Counseling: Had a lengthy discussion regarding patients BMI and weight issues. Patient was instructed on portion control as well as increased activity. Also discussed caloric restrictions with trying to maintain intake less than 2000 Kcal. Discussions were made in accordance with the 5As of weight management. Simple actions such as not eating late and if able to, taking a walk is suggested.   4. Obesity hypoventilation syndrome (HCC) Patient needs to continue with his PAP therapy  5. PAH (pulmonary artery hypertension) (HCC) No evidence of pulmonary hypertension based on echocardiogram from 2020 the most recent echocardiogram was not very good windows.  If symptoms would  persist then I would consider doing right heart catheterization   General Counseling: I have discussed the findings of the evaluation and examination with [name] Walker.  I have also discussed any further diagnostic evaluation thatmay be needed or ordered today. [name] Walker verbalizes understanding of the findings of todays visit. We also reviewed his medications today and discussed drug interactions and side effects including but not limited excessive drowsiness and altered mental states. We also discussed that there is always a risk not just to him but also people around him. he has been encouraged to call the office with any questions or concerns that should arise related to todays visit.  No orders of the defined types were placed in this encounter.    Time spent:35  I have personally obtained a history, examined the patient, evaluated laboratory and imaging results, formulated the assessment and plan and placed orders.    Yevonne Pax, MD [facility] Pulmonary and Critical Care Sleep medicine

## 2021-07-02 NOTE — Patient Instructions (Signed)

## 2021-07-03 ENCOUNTER — Telehealth: Payer: Self-pay

## 2021-07-03 NOTE — Telephone Encounter (Signed)
Cardio referral sent via [facility] to [facility]. Left vm for patient to call other cardio he is currently scheduled at to cancel that appointment-Toni

## 2021-07-07 ENCOUNTER — Encounter: Payer: Self-pay | Admitting: Internal Medicine

## 2021-07-09 NOTE — Telephone Encounter (Signed)
Left patient vm to return my call regarding referral. [facility] indicated they have left 3 messages for patient to call them to schedule appointment-Toni

## 2021-07-12 ENCOUNTER — Ambulatory Visit: Payer: BC Managed Care – PPO | Admitting: Cardiology

## 2021-07-16 NOTE — Telephone Encounter (Signed)
I spoke with patient. He has appointment 07/19/21 @ 8:00 w/ Dr. Rudy Jew

## 2021-07-16 NOTE — Telephone Encounter (Signed)
Left another vm to patient to return my call regarding cardiology referral-Toni

## 2021-07-19 ENCOUNTER — Other Ambulatory Visit: Payer: Self-pay | Admitting: Internal Medicine

## 2021-07-19 DIAGNOSIS — I2721 Secondary pulmonary arterial hypertension: Secondary | ICD-10-CM

## 2021-07-19 DIAGNOSIS — R0602 Shortness of breath: Secondary | ICD-10-CM

## 2021-08-15 ENCOUNTER — Telehealth: Payer: Self-pay

## 2021-08-15 NOTE — Telephone Encounter (Signed)
Medical records printed for patients disability paperwork and given to Desha to fill out for [facility].

## 2021-08-22 ENCOUNTER — Telehealth: Payer: Self-pay

## 2021-08-22 NOTE — Telephone Encounter (Signed)
Faxed Disability paperwork along with requesting medical records to [facility] at [phone]. Two copies of the paperwork have been made. One given to Desha to hold in [facility] office and the other one given to [place] for scan. Original left at the front desk with Toni Amend along with script for OT for patient to pick up.

## 2021-08-23 DIAGNOSIS — J9611 Chronic respiratory failure with hypoxia: Secondary | ICD-10-CM | POA: Insufficient documentation

## 2021-10-07 ENCOUNTER — Telehealth: Payer: Self-pay

## 2021-10-07 NOTE — Telephone Encounter (Signed)
Left patient a voice mail that his 10-08-21 appointment has been rescheduled to 10-10-21 at 10:45.

## 2021-10-08 ENCOUNTER — Ambulatory Visit: Payer: BC Managed Care – PPO | Admitting: Internal Medicine

## 2021-10-10 ENCOUNTER — Ambulatory Visit: Payer: BC Managed Care – PPO | Admitting: Internal Medicine

## 2021-10-11 ENCOUNTER — Ambulatory Visit (INDEPENDENT_AMBULATORY_CARE_PROVIDER_SITE_OTHER): Payer: BC Managed Care – PPO | Admitting: Vascular Surgery

## 2021-10-15 ENCOUNTER — Encounter (INDEPENDENT_AMBULATORY_CARE_PROVIDER_SITE_OTHER): Payer: Self-pay | Admitting: Vascular Surgery

## 2021-10-15 ENCOUNTER — Ambulatory Visit (INDEPENDENT_AMBULATORY_CARE_PROVIDER_SITE_OTHER): Payer: BC Managed Care – PPO | Admitting: Vascular Surgery

## 2021-10-15 ENCOUNTER — Other Ambulatory Visit: Payer: Self-pay

## 2021-10-15 VITALS — BP 172/72 | HR 86 | Ht 66.0 in | Wt >= 6400 oz

## 2021-10-15 DIAGNOSIS — I10 Essential (primary) hypertension: Secondary | ICD-10-CM

## 2021-10-15 DIAGNOSIS — I890 Lymphedema, not elsewhere classified: Secondary | ICD-10-CM | POA: Diagnosis not present

## 2021-10-15 NOTE — Progress Notes (Signed)
MRN : [id number]  [name] Walker is a 46 y.o. (December 21, 1975) male who presents with chief complaint of  Chief Complaint  Patient presents with   Follow-up    1 yr no studies  .  History of Present Illness: Patient returns today in follow up of his leg swelling and lymphedema.  He is using his lymphedema pump regularly but does not wear his compression socks.  Swelling is actually still fairly stable.  He is now on oxygen for hypoxia.  Current Outpatient Medications  Medication Sig Dispense Refill   furosemide (LASIX) 40 MG tablet 40 mg as needed.     gabapentin (NEURONTIN) 100 MG capsule Take 100 mg by mouth 2 (two) times daily.     gabapentin (NEURONTIN) 100 MG capsule Take 1 capsule by mouth 2 (two) times daily.     hydrALAZINE (APRESOLINE) 25 MG tablet Take 1 tablet by mouth 2 (two) times daily.     OXYGEN Inhale into the lungs.     valsartan (DIOVAN) 320 MG tablet Take 320 mg by mouth daily.     metoprolol succinate (TOPROL-XL) 50 MG 24 hr tablet Take by mouth.     potassium chloride (K-DUR) 10 MEQ tablet Take by mouth.     No current facility-administered medications for this visit.    Past Medical History:  Diagnosis Date   Hypertension    Hypoxia    Sleep apnea     Past Surgical History:  Procedure Laterality Date   HERNIA REPAIR     KNEE SURGERY       Social History   Tobacco Use   Smoking status: Never   Smokeless tobacco: Never  Substance Use Topics   Alcohol use: Yes    Alcohol/week: 24.0 standard drinks    Types: 24 Cans of beer per week    Comment: per week   Drug use: Not Currently      Family History  Problem Relation Age of Onset   Alzheimer's disease Mother    Heart disease Father    Hypertension Father    Arthritis Father    Cancer Paternal Grandmother      No Known Allergies   REVIEW OF SYSTEMS (Negative unless checked)  Constitutional: [] Weight loss  [] Fever  [] Chills Cardiac: [] Chest pain   [] Chest pressure   [] Palpitations    [] Shortness of breath when laying flat   [] Shortness of breath at rest   [] Shortness of breath with exertion. Vascular:  [] Pain in legs with walking   [] Pain in legs at rest   [] Pain in legs when laying flat   [] Claudication   [] Pain in feet when walking  [] Pain in feet at rest  [] Pain in feet when laying flat   [] History of DVT   [] Phlebitis   [x] Swelling in legs   [] Varicose veins   [] Non-healing ulcers Pulmonary:   [x] Uses home oxygen   [] Productive cough   [] Hemoptysis   [] Wheeze  [x] COPD   [] Asthma Neurologic:  [] Dizziness  [] Blackouts   [] Seizures   [] History of stroke   [] History of TIA  [] Aphasia   [] Temporary blindness   [] Dysphagia   [] Weakness or numbness in arms   [] Weakness or numbness in legs Musculoskeletal:  [x] Arthritis   [] Joint swelling   [] Joint pain   [] Low back pain Hematologic:  [] Easy bruising  [] Easy bleeding   [] Hypercoagulable state   [] Anemic   Gastrointestinal:  [] Blood in stool   [] Vomiting blood  [] Gastroesophageal reflux/heartburn   [] Abdominal pain Genitourinary:  []   Chronic kidney disease   [] Difficult urination  [] Frequent urination  [] Burning with urination   [] Hematuria Skin:  [x] Rashes   [] Ulcers   [] Wounds Psychological:  [] History of anxiety   []  History of major depression.  Physical Examination  BP (!) 172/72    Pulse 86    Ht 5\' 6"  (1.676 m)    Wt (!) 444 lb (201.4 kg)    BMI 71.66 kg/m  Gen:  WD/WN, NAD.  Morbidly obese Head: [place]/AT, No temporalis wasting. Ear/Nose/Throat: Hearing grossly intact, nares w/o erythema or drainage Eyes: Conjunctiva clear. Sclera non-icteric Neck: Supple.  Trachea midline Pulmonary:  Good air movement, no use of accessory muscles.  Cardiac: RRR, no JVD Vascular:  Vessel Right Left  Radial Palpable Palpable       Musculoskeletal: M/S 5/5 throughout.  No deformity or atrophy.  1+ right lower extremity edema, 1-2+ left lower extremity edema.  Moderate stasis dermatitis changes present bilaterally Neurologic: Sensation  grossly intact in extremities.  Symmetrical.  Speech is fluent.  Psychiatric: Judgment intact, Mood & affect appropriate for pt's clinical situation. Dermatologic: No rashes or ulcers noted.  No cellulitis or open wounds.      Labs No results found for this or any previous visit (from the past 2160 hour(s)).  Radiology No results found.  Assessment/Plan  Essential hypertension blood pressure control important in reducing the progression of atherosclerotic disease. On appropriate oral medications.     Morbid obesity ([place]) This is clearly a major contributing factor to his lower extremity swelling and weight loss would be of huge importance in reducing the swelling.   Lymphedema Patient's lymphedema is reasonably stable.  Lymphedema pump helps, but without compression this is not really as helpful as it needs to be.  Return to clinic in 1 year   [name], MD  10/15/2021 9:48 AM    This note was created with Dragon medical transcription system.  Any errors from dictation are purely unintentional

## 2021-10-21 ENCOUNTER — Other Ambulatory Visit: Payer: Self-pay

## 2021-10-21 ENCOUNTER — Encounter: Payer: Self-pay | Admitting: Physician Assistant

## 2021-10-21 ENCOUNTER — Ambulatory Visit: Payer: BC Managed Care – PPO | Admitting: Physician Assistant

## 2021-10-21 DIAGNOSIS — G4733 Obstructive sleep apnea (adult) (pediatric): Secondary | ICD-10-CM | POA: Diagnosis not present

## 2021-10-21 DIAGNOSIS — E662 Morbid (severe) obesity with alveolar hypoventilation: Secondary | ICD-10-CM

## 2021-10-21 DIAGNOSIS — I2721 Secondary pulmonary arterial hypertension: Secondary | ICD-10-CM | POA: Diagnosis not present

## 2021-10-21 DIAGNOSIS — R0602 Shortness of breath: Secondary | ICD-10-CM | POA: Diagnosis not present

## 2021-10-21 DIAGNOSIS — I10 Essential (primary) hypertension: Secondary | ICD-10-CM

## 2021-10-21 NOTE — Progress Notes (Signed)
[facility] [address] [place], [place] [address]  Pulmonary Sleep Medicine   Office Visit Note  Patient Name: [name] Walker DOB: [birth year] MRN [id number]  Date of Service: 10/24/2021  Complaints/HPI: Pt is here for routine pulmonary follow up. Continues to wear 3L Oxygen during the daytime and states his PCP handles this script. States his breathing has been ok, no changes. BP normally 130/80s and may be elevated from exertion walking into exam room. [facility] monitors CPAP and states he uses this nightly. Patient states he did have evaluation with cardiology, but was not aware of any follow up. Upon review of note it looks like cardiology had planned to do some additional testing and had planned to see him back at the start of Jan. He does not recall receiving any phone calls about this, but could have missed one and will contact their office about next steps.  ROS  General: (-) fever, (-) chills, (-) night sweats, (-) weakness Skin: (-) rashes, (-) itching,. Eyes: (-) visual changes, (-) redness, (-) itching. Nose and Sinuses: (-) nasal stuffiness or itchiness, (-) postnasal drip, (-) nosebleeds, (-) sinus trouble. Mouth and Throat: (-) sore throat, (-) hoarseness. Neck: (-) swollen glands, (-) enlarged thyroid, (-) neck pain. Respiratory: - cough, (-) bloody sputum, + shortness of breath, - wheezing. Cardiovascular: - ankle swelling, (-) chest pain. Lymphatic: (-) lymph node enlargement. Neurologic: (-) numbness, (-) tingling. Psychiatric: (-) anxiety, (-) depression   Current Medication: Outpatient Encounter Medications as of 10/21/2021  Medication Sig   furosemide (LASIX) 40 MG tablet 40 mg as needed.   gabapentin (NEURONTIN) 100 MG capsule Take 100 mg by mouth 2 (two) times daily.   gabapentin (NEURONTIN) 100 MG capsule Take 1 capsule by mouth 2 (two) times daily.   hydrALAZINE (APRESOLINE) 25 MG tablet Take 1 tablet by mouth 2 (two) times daily.   OXYGEN  Inhale into the lungs.   valsartan (DIOVAN) 320 MG tablet Take 320 mg by mouth daily.   metoprolol succinate (TOPROL-XL) 50 MG 24 hr tablet Take by mouth.   potassium chloride (K-DUR) 10 MEQ tablet Take by mouth.   No facility-administered encounter medications on file as of 10/21/2021.    Surgical History: Past Surgical History:  Procedure Laterality Date   HERNIA REPAIR     KNEE SURGERY      Medical History: Past Medical History:  Diagnosis Date   Hypertension    Hypoxia    Sleep apnea     Family History: Family History  Problem Relation Age of Onset   Alzheimer's disease Mother    Heart disease Father    Hypertension Father    Arthritis Father    Cancer Paternal Grandmother     Social History: Social History   Socioeconomic History   Marital status: Single    Spouse name: Not on file   Number of children: Not on file   Years of education: Not on file   Highest education level: Not on file  Occupational History   Not on file  Tobacco Use   Smoking status: Never   Smokeless tobacco: Never  Substance and Sexual Activity   Alcohol use: Yes    Alcohol/week: 24.0 standard drinks    Types: 24 Cans of beer per week    Comment: per week   Drug use: Not Currently   Sexual activity: Not on file  Other Topics Concern   Not on file  Social History Narrative   Not on file  Social Determinants of Health   Financial Resource Strain: Not on file  Food Insecurity: Not on file  Transportation Needs: Not on file  Physical Activity: Not on file  Stress: Not on file  Social Connections: Not on file  Intimate Partner Violence: Not on file    Vital Signs: Blood pressure (!) 150/90, pulse 87, temperature 98.4 F (36.9 C), resp. rate 16, height 5\' 6"  (1.676 m), weight (!) 443 lb (200.9 kg), SpO2 97 %.  Examination: General Appearance: The patient is well-developed, well-nourished, and in no distress. Skin: Gross inspection of skin unremarkable. Head:  normocephalic, no gross deformities. Eyes: no gross deformities noted. ENT: ears appear grossly normal no exudates. Neck: Supple. No thyromegaly. No LAD. Respiratory: Lungs clear to auscultation. Cardiovascular: Normal S1 and S2 without murmur or rub. Extremities: No cyanosis. pulses are equal. Neurologic: Alert and oriented. No involuntary movements.  LABS: No results found for this or any previous visit (from the past 2160 hour(s)).  Radiology: No results found.  No results found.  No results found.    Assessment and Plan: Patient Active Problem List   Diagnosis Date Noted   Chronic hypoxemic respiratory failure (HCC) 08/23/2021   B12 deficiency 11/29/2020   History of hypothyroidism 05/11/2019   Hyperlipidemia 05/11/2019   Sleep apnea 05/11/2019   Lymphedema 03/10/2019   Hypertension 03/08/2019   Morbid obesity (HCC) 03/08/2019   Lower limb ulcer, calf, right, limited to breakdown of skin (HCC) 03/08/2019   Swelling of limb 03/08/2019   Polycythemia 01/26/2015   Cellulitis 04/12/2013   Morbid obesity with BMI of 40.0-44.9, adult (HCC) 04/12/2013   Foreign body anus/rectum 04/05/2013   Encounter for long-term (current) use of medications 11/30/2012   Hypogonadism in male 11/30/2012    1. SOB (shortness of breath) Continue daily oxygen  2. OSA (obstructive sleep apnea) Followed by [facility], continue nightly cpap  3. PAH (pulmonary artery hypertension) (HCC) Will follow up with cardiology regarding plan for updated testing  4. Obesity hypoventilation syndrome (HCC) Continue oxygen and cpap and work on weight loss as able  5. Primary hypertension Continue current medication and monitor and f/u with PCP.    General Counseling: I have discussed the findings of the evaluation and examination with Zayde.  I have also discussed any further diagnostic evaluation thatmay be needed or ordered today. Lional verbalizes understanding of the findings of todays visit. We  also reviewed his medications today and discussed drug interactions and side effects including but not limited excessive drowsiness and altered mental states. We also discussed that there is always a risk not just to him but also people around him. he has been encouraged to call the office with any questions or concerns that should arise related to todays visit.  No orders of the defined types were placed in this encounter.    Time spent: 30  I have personally obtained a history, examined the patient, evaluated laboratory and imaging results, formulated the assessment and plan and placed orders. This patient was seen by 12/02/2012, PA-C in collaboration with Dr. Lynn Ito as a part of collaborative care agreement.     Freda Munro, MD [facility] Pulmonary and Critical Care Sleep medicine

## 2021-10-29 ENCOUNTER — Encounter: Payer: Self-pay | Admitting: Internal Medicine

## 2021-11-20 ENCOUNTER — Telehealth: Payer: Self-pay

## 2021-11-20 NOTE — Telephone Encounter (Signed)
Completed medical records for [facility], faxed to [phone] DOS 04-05-20 to present  ?

## 2022-04-28 ENCOUNTER — Encounter: Payer: Self-pay | Admitting: Oncology

## 2022-05-03 IMAGING — CT CT CHEST HIGH RESOLUTION W/O CM
2 of 5 series · 15 of 36 positions shown, 18 images · non-contrast
Comparison: None.

CLINICAL DATA: Interstitial lung disease

EXAM:
CT CHEST WITHOUT CONTRAST
TECHNIQUE: Multidetector CT imaging of the chest was performed following the
standard protocol without intravenous contrast. High resolution
imaging of the lungs, as well as inspiratory and expiratory imaging,
was performed.

[Series 2: thorax · axial · 0.81mm/px · z∈[-756,-466]mm · 12 of 159 slices shown, 15 images]
[im 7/159  mediastinal]
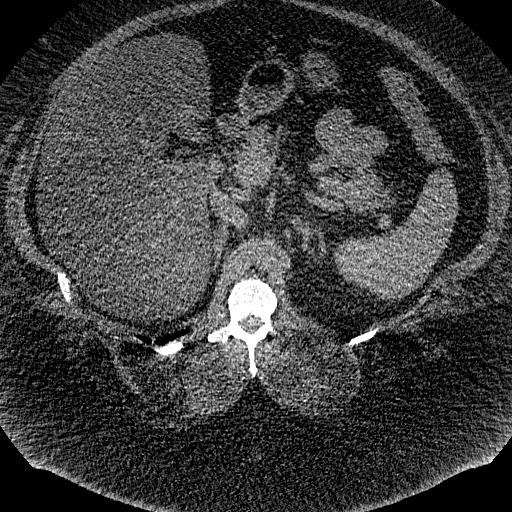
[im 7/159  lung]
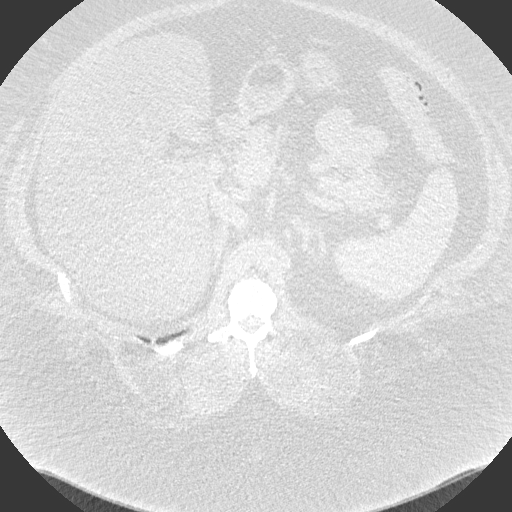
[im 20/159  lung]
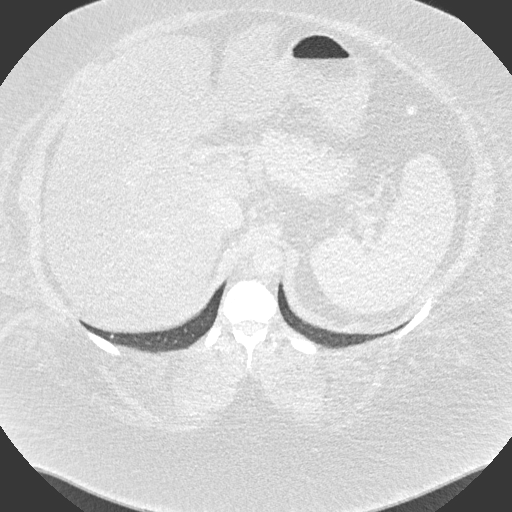
[im 33/159  lung]
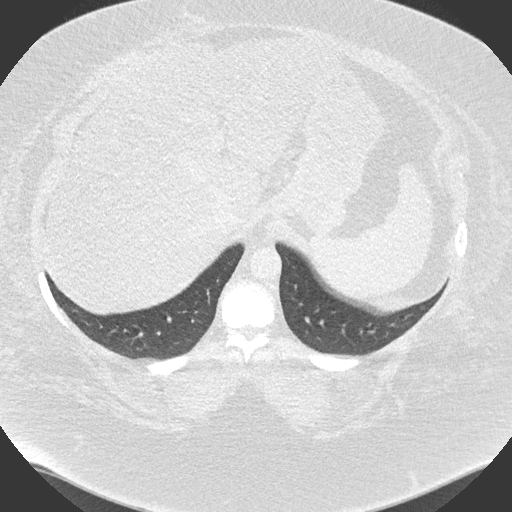
[im 47/159  lung]
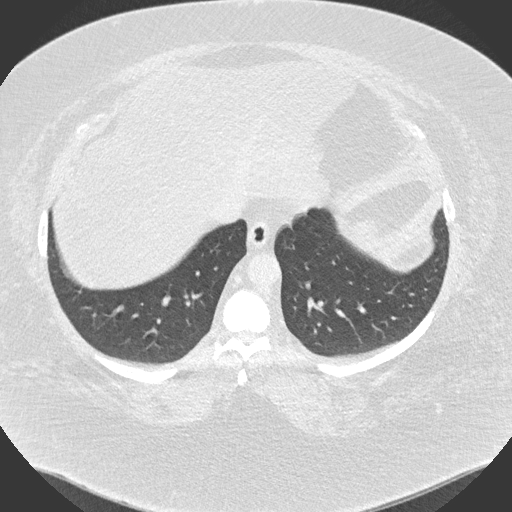
[im 60/159  mediastinal]
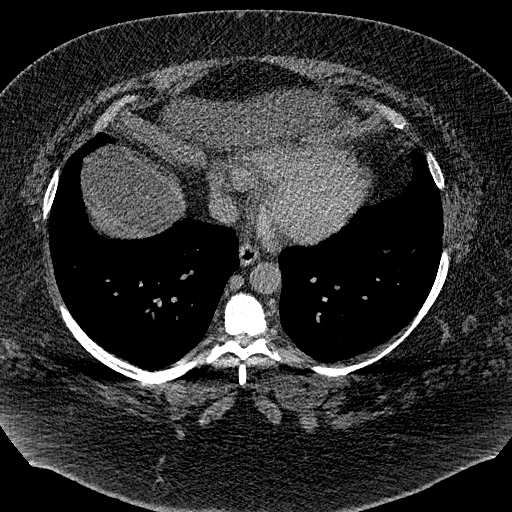
[im 60/159  lung]
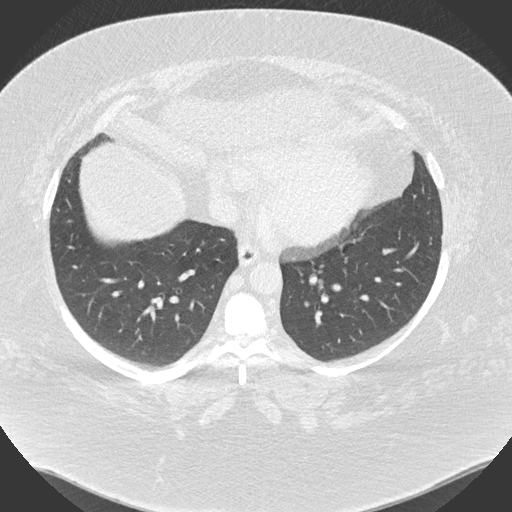
[im 73/159  lung]
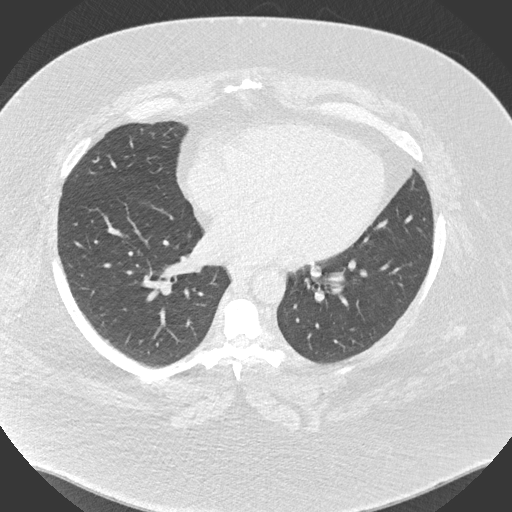
[im 86/159  lung]
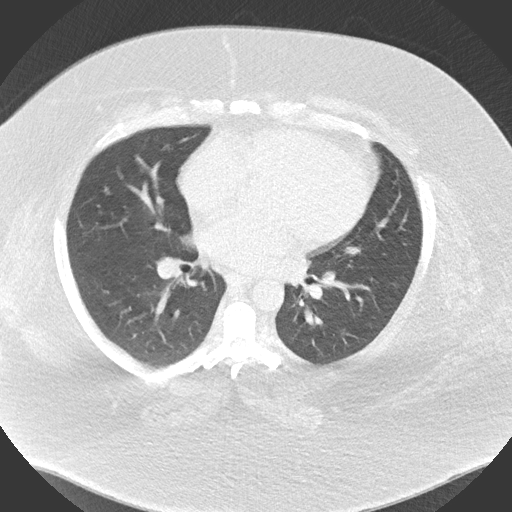
[im 99/159  lung]
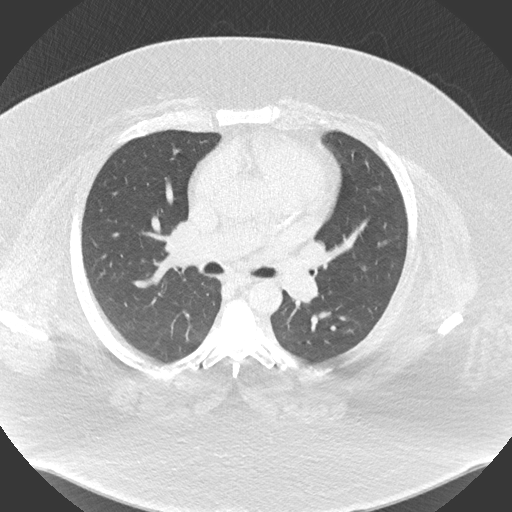
[im 112/159  mediastinal]
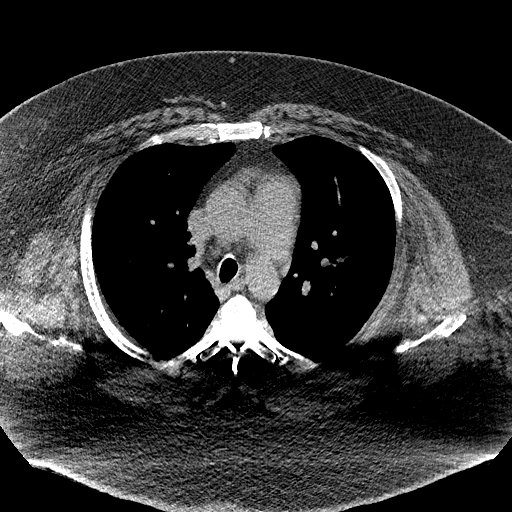
[im 112/159  lung]
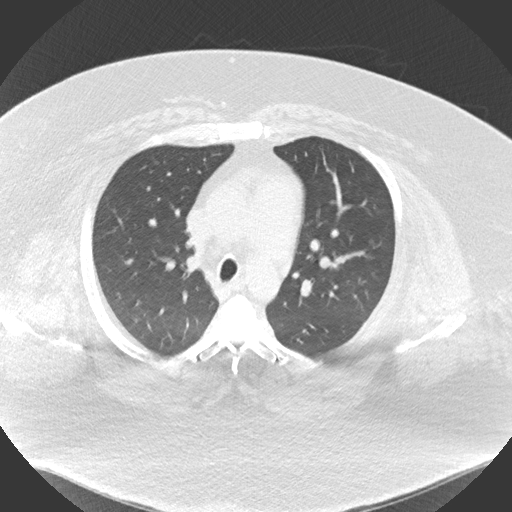
[im 126/159  lung]
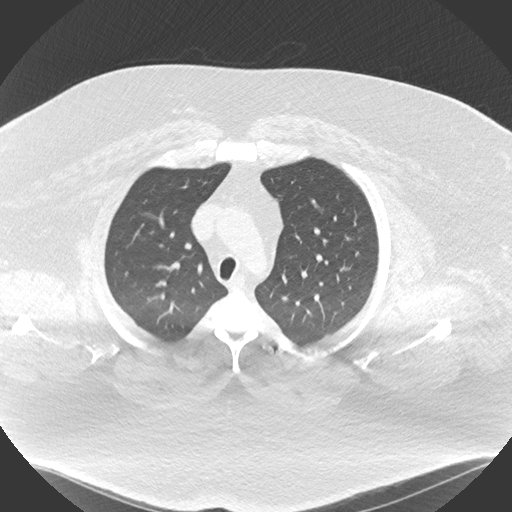
[im 139/159  lung]
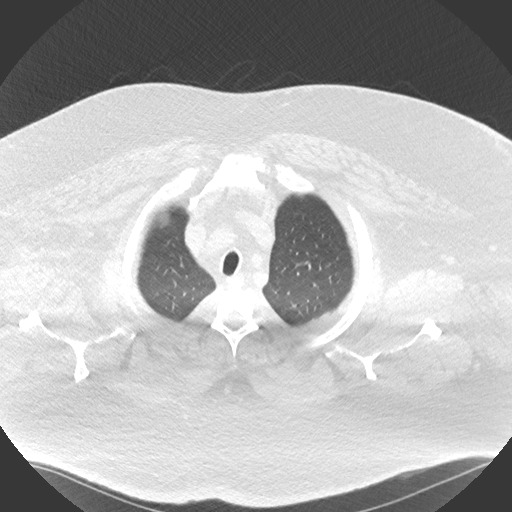
[im 152/159  lung]
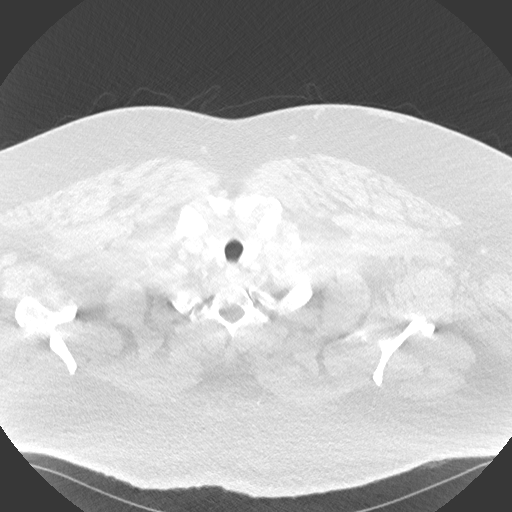

[Series 8: coronal · coronal · 0.72mm/px · 3 of 138 slices shown]
[im 28/138  lung]
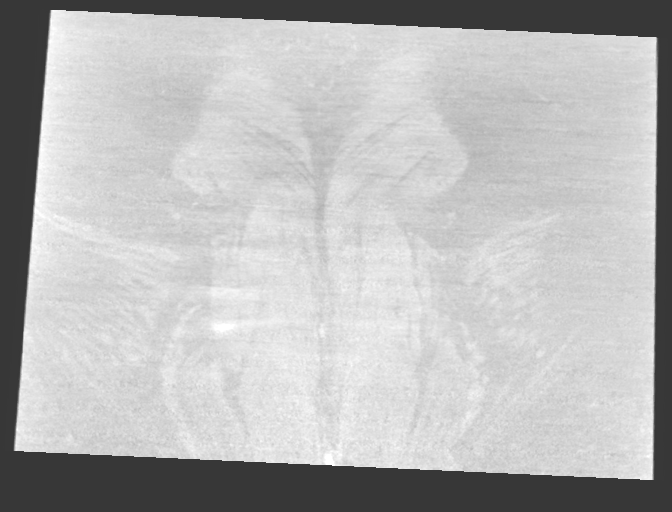
[im 55/138  lung]
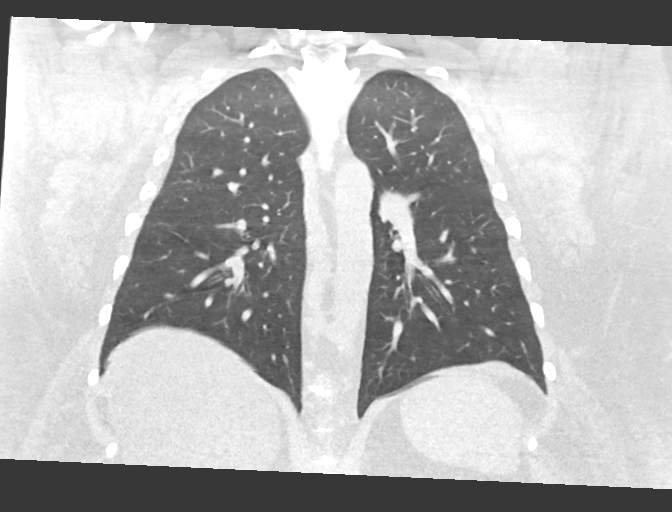
[im 83/138  lung]
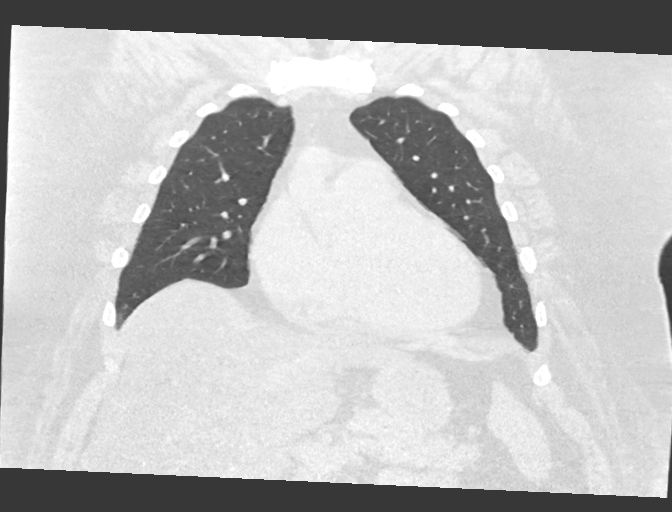

[15 of 36 positions shown; findings below may reference images not displayed]

FINDINGS: Cardiovascular: No significant vascular findings. Mild cardiomegaly.
Left coronary artery calcifications. No pericardial effusion.
Enlargement of the main pulmonary artery measuring up to 3.8 cm.

Mediastinum/Nodes: No enlarged mediastinal, hilar, or axillary lymph
nodes. Thyroid gland, trachea, and esophagus demonstrate no
significant findings.

Lungs/Pleura: Lungs are clear. No evidence of fibrotic interstitial
lung disease. No significant air trapping on expiratory phase
imaging. No pleural effusion or pneumothorax.

Upper Abdomen: No acute abnormality.  Hepatic steatosis.

Musculoskeletal: No chest wall mass or suspicious bone lesions
identified.
IMPRESSION: 1. No evidence of fibrotic interstitial lung disease.
2. Mild cardiomegaly and coronary artery disease.
3. Enlargement of the main pulmonary artery, as can be seen in
pulmonary hypertension.
4. Hepatic steatosis.

## 2022-05-05 ENCOUNTER — Ambulatory Visit: Payer: 59 | Admitting: Internal Medicine

## 2022-05-06 ENCOUNTER — Telehealth: Payer: Self-pay

## 2022-05-06 ENCOUNTER — Encounter: Payer: Self-pay | Admitting: Oncology

## 2022-05-06 ENCOUNTER — Encounter: Payer: Self-pay | Admitting: Internal Medicine

## 2022-05-06 ENCOUNTER — Ambulatory Visit: Payer: 59 | Admitting: Nurse Practitioner

## 2022-05-06 VITALS — BP 150/85 | HR 110 | Temp 98.3°F | Resp 16 | Ht 66.0 in | Wt >= 6400 oz

## 2022-05-06 DIAGNOSIS — I2721 Secondary pulmonary arterial hypertension: Secondary | ICD-10-CM

## 2022-05-06 DIAGNOSIS — Z7189 Other specified counseling: Secondary | ICD-10-CM

## 2022-05-06 DIAGNOSIS — I10 Essential (primary) hypertension: Secondary | ICD-10-CM

## 2022-05-06 DIAGNOSIS — G4733 Obstructive sleep apnea (adult) (pediatric): Secondary | ICD-10-CM

## 2022-05-06 DIAGNOSIS — E662 Morbid (severe) obesity with alveolar hypoventilation: Secondary | ICD-10-CM

## 2022-05-06 DIAGNOSIS — R0602 Shortness of breath: Secondary | ICD-10-CM | POA: Diagnosis not present

## 2022-05-06 NOTE — Telephone Encounter (Signed)
Lvm to move appointment to earlier time today-Toni

## 2022-05-06 NOTE — Progress Notes (Signed)
[facility] [address] [place], [place] [address]  Internal MEDICINE  Office Visit Note  Patient Name: [name] Walker  [id number]  [id number]  Date of Service: 05/06/2022  Chief Complaint  Patient presents with   Follow-up    HPI [name] Walker presents for a follow up visit for chronic respiratory failure, sleep apnea and PAH.  --oxygen concentrator is managed by his PCP,  currently on 3 LPM. States his breathing has been ok, no changes.  --BP normally 130/80s and may be elevated from exertion walking into exam room.  --[facility] monitors CPAP and states he uses this nightly.  -- was referred to cardiology for [facility]. Patient states he did have evaluation with cardiology, but was not aware of any follow up. Still has not scheduled follow up with cardiology for additional testing and evaluation since his last visit at [facility] with Lynn Ito PA-C.    Current Medication: Outpatient Encounter Medications as of 05/06/2022  Medication Sig   furosemide (LASIX) 40 MG tablet 40 mg as needed.   gabapentin (NEURONTIN) 100 MG capsule Take 100 mg by mouth 2 (two) times daily.   gabapentin (NEURONTIN) 100 MG capsule Take 1 capsule by mouth 2 (two) times daily.   OXYGEN Inhale into the lungs.   valsartan (DIOVAN) 320 MG tablet Take 320 mg by mouth daily.   hydrALAZINE (APRESOLINE) 25 MG tablet Take 1 tablet by mouth 2 (two) times daily.   metoprolol succinate (TOPROL-XL) 50 MG 24 hr tablet Take by mouth.   potassium chloride (K-DUR) 10 MEQ tablet Take by mouth.   No facility-administered encounter medications on file as of 05/06/2022.    Surgical History: Past Surgical History:  Procedure Laterality Date   HERNIA REPAIR     KNEE SURGERY      Medical History: Past Medical History:  Diagnosis Date   Hypertension    Hypoxia    Sleep apnea     Family History: Family History  Problem Relation Age of Onset   Alzheimer's disease Mother    Heart disease Father     Hypertension Father    Arthritis Father    Cancer Paternal Grandmother     Social History   Socioeconomic History   Marital status: Single    Spouse name: Not on file   Number of children: Not on file   Years of education: Not on file   Highest education level: Not on file  Occupational History   Not on file  Tobacco Use   Smoking status: Never   Smokeless tobacco: Never  Substance and Sexual Activity   Alcohol use: Yes    Alcohol/week: 24.0 standard drinks of alcohol    Types: 24 Cans of beer per week    Comment: per week   Drug use: Not Currently   Sexual activity: Not on file  Other Topics Concern   Not on file  Social History Narrative   Not on file   Social Determinants of Health   Financial Resource Strain: Not on file  Food Insecurity: Not on file  Transportation Needs: Not on file  Physical Activity: Not on file  Stress: Not on file  Social Connections: Not on file  Intimate Partner Violence: Not on file      ROS   General: (-) fever, (-) chills, (-) night sweats, (-) weakness Skin: (-) rashes, (-) itching,. Eyes: (-) visual changes, (-) redness, (-) itching. Nose and Sinuses: (-) nasal stuffiness or itchiness, (-) postnasal drip, (-) nosebleeds, (-)  sinus trouble. Mouth and Throat: (-) sore throat, (-) hoarseness. Neck: (-) swollen glands, (-) enlarged thyroid, (-) neck pain. Respiratory: - cough, (-) bloody sputum, + shortness of breath, - wheezing. Cardiovascular: - ankle swelling, (-) chest pain. Lymphatic: (-) lymph node enlargement. Neurologic: (-) numbness, (-) tingling. Psychiatric: (-) anxiety, (-) depression    Vital Signs: BP (!) 150/85 (BP Location: Left Arm, Patient Position: Sitting, Cuff Size: Large)   Pulse (!) 110   Temp 98.3 F (36.8 C)   Resp 16   Ht 5\' 6"  (1.676 m)   Wt (!) 437 lb 3.2 oz (198.3 kg)   SpO2 98%   BMI 70.57 kg/m    Examination: General Appearance: The patient is well-developed, well-nourished, and in no  distress. Skin: Gross inspection of skin unremarkable. Head: normocephalic, no gross deformities. Eyes: no gross deformities noted. ENT: ears appear grossly normal no exudates. Neck: Supple. No thyromegaly. No LAD. Respiratory: Lungs clear to auscultation. Cardiovascular: Normal S1 and S2 without murmur or rub. Extremities: No cyanosis. pulses are equal. Neurologic: Alert and oriented. No involuntary movements.     Assessment/Plan: 1. SOB (shortness of breath) Continue daily oxygen on 3 LPM   2. OSA (obstructive sleep apnea) Followed by [facility], continue nightly cpap as instructed   3. PAH (pulmonary artery hypertension) (HCC) Reminded patient to call cardiology to schedule follow up again.    4. Obesity hypoventilation syndrome (HCC) Continue oxygen and cpap and work on weight loss as tolerated   5. Primary hypertension Continue current medication as prescribed and monitor and f/u with PCP.  General Counseling: [name] Walker verbalizes understanding of the findings of todays visit and agrees with plan of treatment. I have discussed any further diagnostic evaluation that may be needed or ordered today. We also reviewed his medications today. he has been encouraged to call the office with any questions or concerns that should arise related to todays visit.    No orders of the defined types were placed in this encounter.   No orders of the defined types were placed in this encounter.   Return in about 6 months (around 11/06/2022) for F/U, pulmonary/sleep DSK.   Total time spent:30 Minutes Time spent includes review of chart, medications, test results, and follow up plan with the patient.   [place] Controlled Substance Database was reviewed by me.  This patient was seen by 11/08/2022, FNP-C in collaboration with Dr. Sallyanne Kuster as a part of collaborative care agreement.   [name] Okey R. Beverely Risen, MSN, FNP-C Internal medicine

## 2022-06-26 ENCOUNTER — Telehealth: Payer: Self-pay | Admitting: Nurse Practitioner

## 2022-06-26 NOTE — Telephone Encounter (Signed)
Received MR from [facility]

## 2022-06-26 NOTE — Telephone Encounter (Signed)
09/15/21-present MR faxed to [place]

## 2022-07-04 ENCOUNTER — Telehealth: Payer: Self-pay | Admitting: Internal Medicine

## 2022-07-04 NOTE — Telephone Encounter (Signed)
Faxed MR to [place]; [phone]

## 2022-07-06 ENCOUNTER — Encounter: Payer: Self-pay | Admitting: Nurse Practitioner

## 2022-07-14 ENCOUNTER — Encounter: Payer: Self-pay | Admitting: Oncology

## 2022-07-14 ENCOUNTER — Encounter (INDEPENDENT_AMBULATORY_CARE_PROVIDER_SITE_OTHER): Payer: Self-pay

## 2022-07-15 ENCOUNTER — Encounter: Payer: Self-pay | Admitting: Oncology

## 2022-07-28 ENCOUNTER — Telehealth: Payer: Self-pay | Admitting: Internal Medicine

## 2022-07-28 NOTE — Telephone Encounter (Signed)
[place] form completed. Faxed back to [phone]. Emailed to [employer]

## 2022-09-21 DIAGNOSIS — R0602 Shortness of breath: Secondary | ICD-10-CM | POA: Diagnosis not present

## 2022-09-21 DIAGNOSIS — I10 Essential (primary) hypertension: Secondary | ICD-10-CM | POA: Diagnosis not present

## 2022-09-22 DIAGNOSIS — I10 Essential (primary) hypertension: Secondary | ICD-10-CM | POA: Diagnosis not present

## 2022-09-22 DIAGNOSIS — E785 Hyperlipidemia, unspecified: Secondary | ICD-10-CM | POA: Diagnosis not present

## 2022-09-30 DIAGNOSIS — E785 Hyperlipidemia, unspecified: Secondary | ICD-10-CM | POA: Diagnosis not present

## 2022-09-30 DIAGNOSIS — E538 Deficiency of other specified B group vitamins: Secondary | ICD-10-CM | POA: Diagnosis not present

## 2022-09-30 DIAGNOSIS — G4730 Sleep apnea, unspecified: Secondary | ICD-10-CM | POA: Diagnosis not present

## 2022-09-30 DIAGNOSIS — Z125 Encounter for screening for malignant neoplasm of prostate: Secondary | ICD-10-CM | POA: Diagnosis not present

## 2022-09-30 DIAGNOSIS — E291 Testicular hypofunction: Secondary | ICD-10-CM | POA: Diagnosis not present

## 2022-09-30 DIAGNOSIS — Z8639 Personal history of other endocrine, nutritional and metabolic disease: Secondary | ICD-10-CM | POA: Diagnosis not present

## 2022-09-30 DIAGNOSIS — I10 Essential (primary) hypertension: Secondary | ICD-10-CM | POA: Diagnosis not present

## 2022-09-30 DIAGNOSIS — J9611 Chronic respiratory failure with hypoxia: Secondary | ICD-10-CM | POA: Diagnosis not present

## 2022-10-04 DIAGNOSIS — R0602 Shortness of breath: Secondary | ICD-10-CM | POA: Diagnosis not present

## 2022-10-04 DIAGNOSIS — I10 Essential (primary) hypertension: Secondary | ICD-10-CM | POA: Diagnosis not present

## 2022-10-08 ENCOUNTER — Encounter: Payer: Self-pay | Admitting: Oncology

## 2022-10-15 ENCOUNTER — Ambulatory Visit (INDEPENDENT_AMBULATORY_CARE_PROVIDER_SITE_OTHER): Payer: BC Managed Care – PPO | Admitting: Nurse Practitioner

## 2022-10-20 ENCOUNTER — Ambulatory Visit (INDEPENDENT_AMBULATORY_CARE_PROVIDER_SITE_OTHER): Payer: 59 | Admitting: Nurse Practitioner

## 2022-10-20 ENCOUNTER — Encounter (INDEPENDENT_AMBULATORY_CARE_PROVIDER_SITE_OTHER): Payer: Self-pay | Admitting: Nurse Practitioner

## 2022-10-20 ENCOUNTER — Encounter: Payer: Self-pay | Admitting: Oncology

## 2022-10-20 VITALS — BP 204/111 | HR 90 | Resp 16 | Wt >= 6400 oz

## 2022-10-20 DIAGNOSIS — I890 Lymphedema, not elsewhere classified: Secondary | ICD-10-CM

## 2022-10-20 DIAGNOSIS — I10 Essential (primary) hypertension: Secondary | ICD-10-CM

## 2022-10-22 DIAGNOSIS — R0602 Shortness of breath: Secondary | ICD-10-CM | POA: Diagnosis not present

## 2022-10-22 DIAGNOSIS — I10 Essential (primary) hypertension: Secondary | ICD-10-CM | POA: Diagnosis not present

## 2022-11-03 ENCOUNTER — Encounter (INDEPENDENT_AMBULATORY_CARE_PROVIDER_SITE_OTHER): Payer: Self-pay | Admitting: Nurse Practitioner

## 2022-11-03 NOTE — Progress Notes (Signed)
Subjective:    Patient ID: [name] Walker, male    DOB: [birth year], 47 y.o.   MRN: [id number] Chief Complaint  Patient presents with   Follow-up    10yrfollow up    [name] a 47year old male who presents today for evaluation of his lymphedema.  The patient has a lymphedema pump which she currently uses.  This helps with his lymphedema.  He has difficulty with wearing compression so he is not as consistent with use of those.  He also elevates his lower extremities when possible.  No evidence of cellulitis today.  Overall he is progressing well.    Review of Systems  Cardiovascular:  Positive for leg swelling.  All other systems reviewed and are negative.      Objective:   Physical Exam Vitals reviewed.  HENT:     Head: Normocephalic.  Cardiovascular:     Rate and Rhythm: Normal rate.     Pulses: Normal pulses.  Pulmonary:     Effort: Pulmonary effort is normal.     Comments: Home O2 Skin:    General: Skin is warm and dry.  Neurological:     Mental Status: He is alert and oriented to person, place, and time.  Psychiatric:        Mood and Affect: Mood normal.        Behavior: Behavior normal.        Thought Content: Thought content normal.        Judgment: Judgment normal.     BP (!) 204/111 (BP Location: Left Arm)   Pulse 90   Resp 16   Wt (!) 454 lb (205.9 kg)   BMI 73.28 kg/m   Past Medical History:  Diagnosis Date   Hypertension    Hypoxia    Sleep apnea     Social History   Socioeconomic History   Marital status: Single    Spouse name: Not on file   Number of children: Not on file   Years of education: Not on file   Highest education level: Not on file  Occupational History   Not on file  Tobacco Use   Smoking status: Never   Smokeless tobacco: Never  Substance and Sexual Activity   Alcohol use: Yes    Alcohol/week: 24.0 standard drinks of alcohol    Types: 24 Cans of beer per week    Comment: per week   Drug use: Not Currently    Sexual activity: Not on file  Other Topics Concern   Not on file  Social History Narrative   Not on file   Social Determinants of Health   Financial Resource Strain: Not on file  Food Insecurity: Not on file  Transportation Needs: Not on file  Physical Activity: Not on file  Stress: Not on file  Social Connections: Not on file  Intimate Partner Violence: Not on file    Past Surgical History:  Procedure Laterality Date   HERNIA REPAIR     KNEE SURGERY      Family History  Problem Relation Age of Onset   Alzheimer's disease Mother    Heart disease Father    Hypertension Father    Arthritis Father    Cancer Paternal Grandmother     No Known Allergies     Latest Ref Rng & Units 10/19/2014   11:43 AM 05/29/2014    9:00 AM  CBC  WBC 3.8 - 10.6 x10 3/mm 9.5  10.0   Hemoglobin 13.0 -  18.0 g/dL 17.0  13.1   Hematocrit 40.0 - 52.0 % 50.6  38.4   Platelets 150 - 440 x10 3/mm 201  202       CMP     Component Value Date/Time   NA 139 05/29/2014 0900   K 4.0 05/29/2014 0900   CL 103 05/29/2014 0900   CO2 27 05/29/2014 0900   GLUCOSE 109 (H) 05/29/2014 0900   BUN 11 05/29/2014 0900   CREATININE 0.83 05/29/2014 0900   CALCIUM 8.8 05/29/2014 0900   PROT 8.2 05/29/2014 0900   ALBUMIN 3.0 (L) 05/29/2014 0900   AST 37 05/29/2014 0900   ALT 54 05/29/2014 0900   ALKPHOS 71 05/29/2014 0900   BILITOT 0.3 05/29/2014 0900   GFRNONAA >60 05/29/2014 0900   GFRAA >60 05/29/2014 0900     No results found.     Assessment & Plan:   1. Lymphedema Recommend:  No surgery or intervention at this point in time.    I have reviewed my discussion with the patient regarding lymphedema and why it  causes symptoms.  Patient will continue wearing graduated compression on a daily basis. The patient should put the compression on first thing in the morning and removing them in the evening. The patient should not sleep in the compression.   In addition, behavioral modification  throughout the day will be continued.  This will include frequent elevation (such as in a recliner), use of over the counter pain medications as needed and exercise such as walking.  The systemic causes for chronic edema such as liver, kidney and cardiac etiologies does not appear to have significant changed over the past year.    The patient will continue aggressive use of the  lymph pump.  This will continue to improve the edema control and prevent sequela such as ulcers and infections.   The patient will follow-up with me on an annual basis.   2. Primary hypertension Continue antihypertensive medications as already ordered, these medications have been reviewed and there are no changes at this time.   Current Outpatient Medications on File Prior to Visit  Medication Sig Dispense Refill   furosemide (LASIX) 40 MG tablet 40 mg as needed.     gabapentin (NEURONTIN) 100 MG capsule Take 100 mg by mouth 2 (two) times daily.     hydrALAZINE (APRESOLINE) 25 MG tablet Take 1 tablet by mouth 2 (two) times daily.     metoprolol succinate (TOPROL-XL) 50 MG 24 hr tablet Take by mouth.     OXYGEN Inhale into the lungs.     potassium chloride (K-DUR) 10 MEQ tablet Take 10 mEq by mouth as needed.     valsartan (DIOVAN) 320 MG tablet Take 320 mg by mouth daily.     gabapentin (NEURONTIN) 100 MG capsule Take 1 capsule by mouth 2 (two) times daily.     No current facility-administered medications on file prior to visit.    There are no Patient Instructions on file for this visit. No follow-ups on file.   Kris Hartmann, NP

## 2022-11-04 ENCOUNTER — Encounter: Payer: Self-pay | Admitting: Oncology

## 2022-11-04 ENCOUNTER — Ambulatory Visit: Payer: 59 | Admitting: Nurse Practitioner

## 2022-11-04 ENCOUNTER — Encounter: Payer: Self-pay | Admitting: Nurse Practitioner

## 2022-11-04 VITALS — BP 130/85 | HR 85 | Temp 98.9°F | Resp 16 | Ht 66.0 in | Wt >= 6400 oz

## 2022-11-04 DIAGNOSIS — R0602 Shortness of breath: Secondary | ICD-10-CM

## 2022-11-04 DIAGNOSIS — I2721 Secondary pulmonary arterial hypertension: Secondary | ICD-10-CM

## 2022-11-04 DIAGNOSIS — E662 Morbid (severe) obesity with alveolar hypoventilation: Secondary | ICD-10-CM | POA: Diagnosis not present

## 2022-11-04 DIAGNOSIS — G4733 Obstructive sleep apnea (adult) (pediatric): Secondary | ICD-10-CM

## 2022-11-04 DIAGNOSIS — I10 Essential (primary) hypertension: Secondary | ICD-10-CM | POA: Diagnosis not present

## 2022-11-04 NOTE — Progress Notes (Unsigned)
[facility] [place], [place] [address]  Internal MEDICINE  Office Visit Note  Patient Name: [name] Walker  [id number]  [id number]  Date of Service: 11/04/2022  Chief Complaint  Patient presents with   Follow-up    HPI [name] Walker presents for a follow-up visit for   Uses Bipap at night, still using every night, at least 4 hours or more each night, roughly 8 hrs a night.  3 lpm on supp oxygen at night Poss pulm arterial hypertension, still needs to see cardiology  Copay too expensive right now.    Current Medication: Outpatient Encounter Medications as of 11/04/2022  Medication Sig   furosemide (LASIX) 40 MG tablet 40 mg as needed.   gabapentin (NEURONTIN) 100 MG capsule Take 100 mg by mouth 2 (two) times daily.   OXYGEN Inhale into the lungs.   valsartan (DIOVAN) 320 MG tablet Take 320 mg by mouth daily.   gabapentin (NEURONTIN) 100 MG capsule Take 1 capsule by mouth 2 (two) times daily.   hydrALAZINE (APRESOLINE) 25 MG tablet Take 1 tablet by mouth 2 (two) times daily.   metoprolol succinate (TOPROL-XL) 50 MG 24 hr tablet Take by mouth.   potassium chloride (K-DUR) 10 MEQ tablet Take 10 mEq by mouth as needed.   No facility-administered encounter medications on file as of 11/04/2022.    Surgical History: Past Surgical History:  Procedure Laterality Date   HERNIA REPAIR     KNEE SURGERY      Medical History: Past Medical History:  Diagnosis Date   Hypertension    Hypoxia    Sleep apnea     Family History: Family History  Problem Relation Age of Onset   Alzheimer's disease Mother    Heart disease Father    Hypertension Father    Arthritis Father    Cancer Paternal Grandmother     Social History   Socioeconomic History   Marital status: Single    Spouse name: Not on file   Number of children: Not on file   Years of education: Not on file   Highest education level: Not on file  Occupational History   Not on file  Tobacco Use    Smoking status: Never   Smokeless tobacco: Never  Substance and Sexual Activity   Alcohol use: Yes    Alcohol/week: 24.0 standard drinks of alcohol    Types: 24 Cans of beer per week    Comment: per week   Drug use: Not Currently   Sexual activity: Not on file  Other Topics Concern   Not on file  Social History Narrative   Not on file   Social Determinants of Health   Financial Resource Strain: Not on file  Food Insecurity: Not on file  Transportation Needs: Not on file  Physical Activity: Not on file  Stress: Not on file  Social Connections: Not on file  Intimate Partner Violence: Not on file      Review of Systems  Vital Signs: BP 130/85 Comment: 150/95  Pulse 85   Temp 98.9 F (37.2 C)   Resp 16   Ht 5' 6"$  (1.676 m)   Wt (!) 459 lb (208.2 kg)   SpO2 93% Comment: 3L  BMI 74.08 kg/m    Physical Exam     Assessment/Plan:   General Counseling: [name] Walker verbalizes understanding of the findings of todays visit and agrees with plan of treatment. I have discussed any further diagnostic evaluation that may be needed or ordered today.  We also reviewed his medications today. he has been encouraged to call the office with any questions or concerns that should arise related to todays visit.    No orders of the defined types were placed in this encounter.   No orders of the defined types were placed in this encounter.   Return in about 6 months (around 05/05/2023) for F/U, pulmonary/sleep.   Total time spent:*** Minutes Time spent includes review of chart, medications, test results, and follow up plan with the patient.    Controlled Substance Database was reviewed by me.  This patient was seen by Jonetta Osgood, FNP-C in collaboration with Dr. Clayborn Bigness as a part of collaborative care agreement.   [name] Lentsch R. Valetta Fuller, MSN, FNP-C Internal medicine

## 2022-11-20 DIAGNOSIS — R0602 Shortness of breath: Secondary | ICD-10-CM | POA: Diagnosis not present

## 2022-11-20 DIAGNOSIS — I10 Essential (primary) hypertension: Secondary | ICD-10-CM | POA: Diagnosis not present

## 2022-12-03 DIAGNOSIS — R0602 Shortness of breath: Secondary | ICD-10-CM | POA: Diagnosis not present

## 2022-12-03 DIAGNOSIS — I10 Essential (primary) hypertension: Secondary | ICD-10-CM | POA: Diagnosis not present

## 2022-12-21 DIAGNOSIS — I10 Essential (primary) hypertension: Secondary | ICD-10-CM | POA: Diagnosis not present

## 2022-12-21 DIAGNOSIS — R0602 Shortness of breath: Secondary | ICD-10-CM | POA: Diagnosis not present

## 2022-12-25 ENCOUNTER — Telehealth: Payer: Self-pay | Admitting: Internal Medicine

## 2022-12-25 NOTE — Telephone Encounter (Signed)
05/06/22 & 11/04/22 office notes faxed to [place]; [phone]

## 2023-01-03 DIAGNOSIS — I10 Essential (primary) hypertension: Secondary | ICD-10-CM | POA: Diagnosis not present

## 2023-01-03 DIAGNOSIS — R0602 Shortness of breath: Secondary | ICD-10-CM | POA: Diagnosis not present

## 2023-01-20 DIAGNOSIS — R0602 Shortness of breath: Secondary | ICD-10-CM | POA: Diagnosis not present

## 2023-01-20 DIAGNOSIS — I10 Essential (primary) hypertension: Secondary | ICD-10-CM | POA: Diagnosis not present

## 2023-02-02 DIAGNOSIS — R0602 Shortness of breath: Secondary | ICD-10-CM | POA: Diagnosis not present

## 2023-02-02 DIAGNOSIS — I10 Essential (primary) hypertension: Secondary | ICD-10-CM | POA: Diagnosis not present

## 2023-02-20 DIAGNOSIS — R0602 Shortness of breath: Secondary | ICD-10-CM | POA: Diagnosis not present

## 2023-02-20 DIAGNOSIS — I10 Essential (primary) hypertension: Secondary | ICD-10-CM | POA: Diagnosis not present

## 2023-03-05 DIAGNOSIS — R0602 Shortness of breath: Secondary | ICD-10-CM | POA: Diagnosis not present

## 2023-03-05 DIAGNOSIS — I10 Essential (primary) hypertension: Secondary | ICD-10-CM | POA: Diagnosis not present

## 2023-03-22 DIAGNOSIS — I10 Essential (primary) hypertension: Secondary | ICD-10-CM | POA: Diagnosis not present

## 2023-03-22 DIAGNOSIS — R0602 Shortness of breath: Secondary | ICD-10-CM | POA: Diagnosis not present

## 2023-03-30 DIAGNOSIS — Z125 Encounter for screening for malignant neoplasm of prostate: Secondary | ICD-10-CM | POA: Diagnosis not present

## 2023-03-30 DIAGNOSIS — I10 Essential (primary) hypertension: Secondary | ICD-10-CM | POA: Diagnosis not present

## 2023-03-30 DIAGNOSIS — E785 Hyperlipidemia, unspecified: Secondary | ICD-10-CM | POA: Diagnosis not present

## 2023-04-04 DIAGNOSIS — R0602 Shortness of breath: Secondary | ICD-10-CM | POA: Diagnosis not present

## 2023-04-04 DIAGNOSIS — I10 Essential (primary) hypertension: Secondary | ICD-10-CM | POA: Diagnosis not present

## 2023-04-06 DIAGNOSIS — E785 Hyperlipidemia, unspecified: Secondary | ICD-10-CM | POA: Diagnosis not present

## 2023-04-06 DIAGNOSIS — E538 Deficiency of other specified B group vitamins: Secondary | ICD-10-CM | POA: Diagnosis not present

## 2023-04-06 DIAGNOSIS — G4730 Sleep apnea, unspecified: Secondary | ICD-10-CM | POA: Diagnosis not present

## 2023-04-06 DIAGNOSIS — Z8639 Personal history of other endocrine, nutritional and metabolic disease: Secondary | ICD-10-CM | POA: Diagnosis not present

## 2023-04-06 DIAGNOSIS — I10 Essential (primary) hypertension: Secondary | ICD-10-CM | POA: Diagnosis not present

## 2023-04-06 DIAGNOSIS — J9611 Chronic respiratory failure with hypoxia: Secondary | ICD-10-CM | POA: Diagnosis not present

## 2023-04-06 DIAGNOSIS — I890 Lymphedema, not elsewhere classified: Secondary | ICD-10-CM | POA: Diagnosis not present

## 2023-04-06 DIAGNOSIS — E291 Testicular hypofunction: Secondary | ICD-10-CM | POA: Diagnosis not present

## 2023-04-06 DIAGNOSIS — Z0000 Encounter for general adult medical examination without abnormal findings: Secondary | ICD-10-CM | POA: Diagnosis not present

## 2023-04-22 DIAGNOSIS — R0602 Shortness of breath: Secondary | ICD-10-CM | POA: Diagnosis not present

## 2023-04-22 DIAGNOSIS — I10 Essential (primary) hypertension: Secondary | ICD-10-CM | POA: Diagnosis not present

## 2023-05-01 ENCOUNTER — Telehealth: Payer: Self-pay | Admitting: Internal Medicine

## 2023-05-01 NOTE — Telephone Encounter (Signed)
Received [employer] office for MR faxed 39 pages; [phone]

## 2023-05-05 ENCOUNTER — Encounter: Payer: Self-pay | Admitting: Nurse Practitioner

## 2023-05-05 ENCOUNTER — Ambulatory Visit: Payer: 59 | Admitting: Nurse Practitioner

## 2023-05-05 VITALS — BP 135/75 | HR 102 | Temp 98.0°F | Resp 16 | Ht 66.0 in | Wt >= 6400 oz

## 2023-05-05 DIAGNOSIS — G4733 Obstructive sleep apnea (adult) (pediatric): Secondary | ICD-10-CM

## 2023-05-05 DIAGNOSIS — R0602 Shortness of breath: Secondary | ICD-10-CM | POA: Diagnosis not present

## 2023-05-05 DIAGNOSIS — I2721 Secondary pulmonary arterial hypertension: Secondary | ICD-10-CM

## 2023-05-05 DIAGNOSIS — E662 Morbid (severe) obesity with alveolar hypoventilation: Secondary | ICD-10-CM

## 2023-05-05 DIAGNOSIS — I10 Essential (primary) hypertension: Secondary | ICD-10-CM | POA: Diagnosis not present

## 2023-05-05 NOTE — Progress Notes (Signed)
[facility] [address] [place], [place] [address]  Internal MEDICINE  Office Visit Note  Patient Name: [name] Walker  [id number]  [id number]  Date of Service: 05/05/2023  Chief Complaint  Patient presents with   Follow-up   Sleep Apnea    HPI [name] presents for a follow-up visit for OSA, on bipap, oxygen dependent.  Uses Bipap at night, still using every night, at least 4 hours or more each night, roughly 8 hrs a night. Gets supplies through [facility]  3 lpm on supp oxygen continuous via nasal cannula Poss pulm arterial hypertension, still needs to see cardiology --Copay too expensive right now.  Our only purpose is summarizing what is going on and who manages it. His oxygen is ordered by PCP and his OSA is managed by PCP. He needs a cardiologist for eval for PAH but has not seen one yet. He is not on any inhalers and his medications are managed by his other providers.    Current Medication: Outpatient Encounter Medications as of 05/05/2023  Medication Sig   furosemide (LASIX) 40 MG tablet 40 mg as needed.   gabapentin (NEURONTIN) 100 MG capsule Take 100 mg by mouth 2 (two) times daily.   OXYGEN Inhale into the lungs.   valsartan (DIOVAN) 320 MG tablet Take 320 mg by mouth daily.   gabapentin (NEURONTIN) 100 MG capsule Take 1 capsule by mouth 2 (two) times daily.   hydrALAZINE (APRESOLINE) 25 MG tablet Take 1 tablet by mouth 2 (two) times daily.   metoprolol succinate (TOPROL-XL) 50 MG 24 hr tablet Take by mouth.   potassium chloride (K-DUR) 10 MEQ tablet Take 10 mEq by mouth as needed.   No facility-administered encounter medications on file as of 05/05/2023.    Surgical History: Past Surgical History:  Procedure Laterality Date   HERNIA REPAIR     KNEE SURGERY      Medical History: Past Medical History:  Diagnosis Date   Hypertension    Hypoxia    Sleep apnea     Family History: Family History  Problem Relation Age of Onset   Alzheimer's  disease Mother    Heart disease Father    Hypertension Father    Arthritis Father    Cancer Paternal Grandmother     Social History   Socioeconomic History   Marital status: Single    Spouse name: Not on file   Number of children: Not on file   Years of education: Not on file   Highest education level: Not on file  Occupational History   Not on file  Tobacco Use   Smoking status: Never   Smokeless tobacco: Never  Substance and Sexual Activity   Alcohol use: Yes    Alcohol/week: 24.0 standard drinks of alcohol    Types: 24 Cans of beer per week    Comment: per week   Drug use: Not Currently   Sexual activity: Not on file  Other Topics Concern   Not on file  Social History Narrative   Not on file   Social Determinants of Health   Financial Resource Strain: Low Risk  (04/06/2023)   Received from [facility]   Overall Financial Resource Strain (CARDIA)    Difficulty of Paying Living Expenses: Not hard at all  Food Insecurity: No Food Insecurity (04/06/2023)   Received from [facility]   Hunger Vital Sign    Worried About Running Out of Food in the Last Year: Never  true    Ran Out of Food in the Last Year: Never true  Transportation Needs: No Transportation Needs (04/06/2023)   Received from [facility] - Transportation    In the past 12 months, has lack of transportation kept you from medical appointments or from getting medications?: No    Lack of Transportation (Non-Medical): No  Physical Activity: Not on file  Stress: Not on file  Social Connections: Unknown (01/28/2022)   Received from [facility]   Social Network    Social Network: Not on file  Intimate Partner Violence: Unknown (12/20/2021)   Received from [facility], [facility]   HITS    Physically Hurt: Not on file    Insult or Talk Down To: Not on file    Threaten Physical Harm: Not on file    Scream or Curse: Not on file       Review of Systems  Constitutional:  Positive for activity change and fatigue.  HENT: Negative.    Respiratory:  Positive for chest tightness, shortness of breath and wheezing. Negative for cough.   Cardiovascular: Negative.  Negative for chest pain and palpitations.  Musculoskeletal:  Positive for arthralgias and gait problem.    Vital Signs: BP 135/75   Pulse (!) 102   Temp 98 F (36.7 C)   Resp 16   Ht 5\' 6"  (1.676 m)   Wt (!) 456 lb (206.8 kg)   SpO2 97% Comment: Patient is on 3 liters of O2  BMI 73.60 kg/m    Physical Exam Vitals reviewed.  Constitutional:      General: He is not in acute distress.    Appearance: Normal appearance. He is obese. He is ill-appearing.  HENT:     Head: Normocephalic and atraumatic.  Eyes:     Pupils: Pupils are equal, round, and reactive to light.  Cardiovascular:     Rate and Rhythm: Normal rate and regular rhythm.     Heart sounds: Normal heart sounds. No murmur heard. Pulmonary:     Effort: Tachypnea and accessory muscle usage present. No respiratory distress.     Breath sounds: Decreased air movement present. Examination of the right-upper field reveals wheezing. Examination of the left-upper field reveals wheezing. Examination of the right-middle field reveals wheezing. Examination of the left-middle field reveals wheezing. Examination of the right-lower field reveals decreased breath sounds. Examination of the left-lower field reveals decreased breath sounds. Decreased breath sounds and wheezing present.  Neurological:     Mental Status: He is alert and oriented to person, place, and time.  Psychiatric:        Mood and Affect: Mood normal.        Behavior: Behavior normal.        Assessment/Plan: 1. OSA (obstructive sleep apnea) Continue to use BiPAP at night as instructed.   2. Obesity hypoventilation syndrome (HCC) Patient aware that weight loss would help some. Physical activity is difficult due to SOB and being  dependent on oxygen. Encourage healthy diet to aid in weight loss.   3. SOB (shortness of breath) Continue to use supplemental oxygen as instructed.   4. PAH (pulmonary artery hypertension) (HCC) Encouraged patient to call cardiology again or we could do a new referral if needed, deferred per patient request but this is something that needs to be addressed and further evaluated at some point. PCP can address topic as well.    General Counseling: Mahamed verbalizes understanding of the findings of  todays visit and agrees with plan of treatment. I have discussed any further diagnostic evaluation that may be needed or ordered today. We also reviewed his medications today. he has been encouraged to call the office with any questions or concerns that should arise related to todays visit.    No orders of the defined types were placed in this encounter.   No orders of the defined types were placed in this encounter.   Return in about 6 months (around 11/05/2023) for F/U, pulmonary/sleep Peola Joynt or dsk.   Total time spent:30 Minutes Time spent includes review of chart, medications, test results, and follow up plan with the patient.   [place] Controlled Substance Database was reviewed by me.  This patient was seen by Sallyanne Kuster, FNP-C in collaboration with Dr. Beverely Risen as a part of collaborative care agreement.   Eliseo Withers R. Tedd Sias, MSN, FNP-C Internal medicine

## 2023-05-23 DIAGNOSIS — R0602 Shortness of breath: Secondary | ICD-10-CM | POA: Diagnosis not present

## 2023-05-23 DIAGNOSIS — I10 Essential (primary) hypertension: Secondary | ICD-10-CM | POA: Diagnosis not present

## 2023-05-24 ENCOUNTER — Encounter: Payer: Self-pay | Admitting: Nurse Practitioner

## 2023-06-05 DIAGNOSIS — I10 Essential (primary) hypertension: Secondary | ICD-10-CM | POA: Diagnosis not present

## 2023-06-05 DIAGNOSIS — R0602 Shortness of breath: Secondary | ICD-10-CM | POA: Diagnosis not present

## 2023-06-22 DIAGNOSIS — R0602 Shortness of breath: Secondary | ICD-10-CM | POA: Diagnosis not present

## 2023-06-22 DIAGNOSIS — I10 Essential (primary) hypertension: Secondary | ICD-10-CM | POA: Diagnosis not present

## 2023-07-05 DIAGNOSIS — I10 Essential (primary) hypertension: Secondary | ICD-10-CM | POA: Diagnosis not present

## 2023-07-05 DIAGNOSIS — R0602 Shortness of breath: Secondary | ICD-10-CM | POA: Diagnosis not present

## 2023-07-23 DIAGNOSIS — I10 Essential (primary) hypertension: Secondary | ICD-10-CM | POA: Diagnosis not present

## 2023-07-23 DIAGNOSIS — R0602 Shortness of breath: Secondary | ICD-10-CM | POA: Diagnosis not present

## 2023-08-05 DIAGNOSIS — R0602 Shortness of breath: Secondary | ICD-10-CM | POA: Diagnosis not present

## 2023-08-05 DIAGNOSIS — I10 Essential (primary) hypertension: Secondary | ICD-10-CM | POA: Diagnosis not present

## 2023-08-22 DIAGNOSIS — R0602 Shortness of breath: Secondary | ICD-10-CM | POA: Diagnosis not present

## 2023-08-22 DIAGNOSIS — I10 Essential (primary) hypertension: Secondary | ICD-10-CM | POA: Diagnosis not present

## 2023-09-04 DIAGNOSIS — R0602 Shortness of breath: Secondary | ICD-10-CM | POA: Diagnosis not present

## 2023-09-04 DIAGNOSIS — I10 Essential (primary) hypertension: Secondary | ICD-10-CM | POA: Diagnosis not present

## 2023-09-29 ENCOUNTER — Encounter: Payer: Self-pay | Admitting: Oncology

## 2023-10-05 ENCOUNTER — Encounter: Payer: Self-pay | Admitting: Oncology

## 2023-10-06 ENCOUNTER — Ambulatory Visit (INDEPENDENT_AMBULATORY_CARE_PROVIDER_SITE_OTHER): Payer: 59 | Admitting: Nurse Practitioner

## 2023-10-12 ENCOUNTER — Ambulatory Visit: Payer: Medicare Other | Admitting: Nurse Practitioner

## 2023-10-12 ENCOUNTER — Encounter: Payer: Self-pay | Admitting: Oncology

## 2023-10-12 ENCOUNTER — Encounter: Payer: Self-pay | Admitting: Nurse Practitioner

## 2023-10-12 VITALS — BP 178/98 | HR 99 | Temp 98.9°F | Resp 16 | Ht 66.0 in | Wt >= 6400 oz

## 2023-10-12 DIAGNOSIS — G4733 Obstructive sleep apnea (adult) (pediatric): Secondary | ICD-10-CM | POA: Diagnosis not present

## 2023-10-12 DIAGNOSIS — I2721 Secondary pulmonary arterial hypertension: Secondary | ICD-10-CM | POA: Diagnosis not present

## 2023-10-12 DIAGNOSIS — E662 Morbid (severe) obesity with alveolar hypoventilation: Secondary | ICD-10-CM

## 2023-10-12 DIAGNOSIS — J9611 Chronic respiratory failure with hypoxia: Secondary | ICD-10-CM

## 2023-10-12 DIAGNOSIS — R0602 Shortness of breath: Secondary | ICD-10-CM

## 2023-10-12 DIAGNOSIS — I10 Essential (primary) hypertension: Secondary | ICD-10-CM

## 2023-10-12 NOTE — Progress Notes (Signed)
[facility] [address] [place], [place] [address]  Internal MEDICINE  Office Visit Note  Patient Name: [name] Walker  [id number]  [id number]  Date of Service: 10/12/2023  Chief Complaint  Patient presents with   Follow-up    HPI Tyree presents for a follow-up visit for OSA, PAH, hypoxia.  OSA on CPAP -- [facility] -- needs new cardiology referral, specialist visits do not have a high copay anymore so he should be able to see a cardiologist this year. Has not had further evaluation regarding probable PAH.  Oxygen dependent -- on 3LPM via [place]  Discussed PFT, it has been 3 years sine he had one done but he reports that he does not think he could tolerate the testing to get accurate results. I agree with his statement after assessing patient today, I do not think he could tolerate a PFT or even in office spirometry testing.  Hypertension -- blood pressure is significantly elevated today, he has an appt with his PCP immediately after this office visit.  Has his annual re-certification for his long term disability paperwork from his job, will complete this and let the patient know when he needs to pick it up from the clinic.    Current Medication: Outpatient Encounter Medications as of 10/12/2023  Medication Sig   furosemide (LASIX) 40 MG tablet 40 mg as needed.   gabapentin (NEURONTIN) 100 MG capsule Take 100 mg by mouth 2 (two) times daily.   OXYGEN Inhale into the lungs.   valsartan (DIOVAN) 320 MG tablet Take 320 mg by mouth daily.   gabapentin (NEURONTIN) 100 MG capsule Take 1 capsule by mouth 2 (two) times daily.   hydrALAZINE (APRESOLINE) 25 MG tablet Take 1 tablet by mouth 2 (two) times daily.   metoprolol succinate (TOPROL-XL) 50 MG 24 hr tablet Take by mouth.   potassium chloride (K-DUR) 10 MEQ tablet Take 10 mEq by mouth as needed.   No facility-administered encounter medications on file as of 10/12/2023.    Surgical History: Past Surgical  History:  Procedure Laterality Date   HERNIA REPAIR     KNEE SURGERY      Medical History: Past Medical History:  Diagnosis Date   Hypertension    Hypoxia    Sleep apnea     Family History: Family History  Problem Relation Age of Onset   Alzheimer's disease Mother    Heart disease Father    Hypertension Father    Arthritis Father    Cancer Paternal Grandmother     Social History   Socioeconomic History   Marital status: Single    Spouse name: Not on file   Number of children: Not on file   Years of education: Not on file   Highest education level: Not on file  Occupational History   Not on file  Tobacco Use   Smoking status: Never   Smokeless tobacco: Never  Substance and Sexual Activity   Alcohol use: Yes    Alcohol/week: 24.0 standard drinks of alcohol    Types: 24 Cans of beer per week    Comment: per week   Drug use: Not Currently   Sexual activity: Not on file  Other Topics Concern   Not on file  Social History Narrative   Not on file   Social Drivers of Health   Financial Resource Strain: Low Risk  (04/06/2023)   Received from [facility]   Overall Financial Resource Strain (CARDIA)  Difficulty of Paying Living Expenses: Not hard at all  Food Insecurity: No Food Insecurity (04/06/2023)   Received from [facility]   Hunger Vital Sign    Worried About Running Out of Food in the Last Year: Never true    Ran Out of Food in the Last Year: Never true  Transportation Needs: No Transportation Needs (04/06/2023)   Received from [facility] - Transportation    In the past 12 months, has lack of transportation kept you from medical appointments or from getting medications?: No    Lack of Transportation (Non-Medical): No  Physical Activity: Not on file  Stress: Not on file  Social Connections: Unknown (01/28/2022)   Received from [facility], [facility]   Social Network    Social  Network: Not on file  Intimate Partner Violence: Unknown (12/20/2021)   Received from [facility], [facility]   HITS    Physically Hurt: Not on file    Insult or Talk Down To: Not on file    Threaten Physical Harm: Not on file    Scream or Curse: Not on file      Review of Systems  Constitutional:  Positive for activity change and fatigue.  HENT: Negative.    Respiratory:  Positive for chest tightness, shortness of breath and wheezing. Negative for cough.   Cardiovascular: Negative.  Negative for chest pain and palpitations.  Musculoskeletal:  Positive for arthralgias and gait problem.    Vital Signs: BP (!) 178/98   Pulse 99   Temp 98.9 F (37.2 C)   Resp 16   Ht 5\' 6"  (1.676 m)   Wt (!) 460 lb 6.4 oz (208.8 kg)   SpO2 99% Comment: 3L  BMI 74.31 kg/m    Physical Exam Vitals reviewed.  Constitutional:      General: He is not in acute distress.    Appearance: Normal appearance. He is obese. He is ill-appearing.  HENT:     Head: Normocephalic and atraumatic.  Eyes:     Pupils: Pupils are equal, round, and reactive to light.  Cardiovascular:     Rate and Rhythm: Normal rate and regular rhythm.     Heart sounds: Normal heart sounds. No murmur heard. Pulmonary:     Effort: Tachypnea and accessory muscle usage present. No respiratory distress.     Breath sounds: Decreased air movement present. Examination of the right-upper field reveals wheezing. Examination of the left-upper field reveals wheezing. Examination of the right-middle field reveals wheezing. Examination of the left-middle field reveals wheezing. Examination of the right-lower field reveals decreased breath sounds. Examination of the left-lower field reveals decreased breath sounds. Decreased breath sounds and wheezing present.  Neurological:     Mental Status: He is alert and oriented to person, place, and time.  Psychiatric:        Mood and Affect: Mood normal.        Behavior: Behavior normal.         Assessment/Plan: 1. OSA (obstructive sleep apnea) (Primary) [facility] maintenance and supplies.   2. Obesity hypoventilation syndrome (HCC) On CPAP at night, on supplemental oxygen continuously at 3 LPM via [place].   3. Chronic hypoxemic respiratory failure (HCC) On continuous supplemental oxygen 3 LPM via [place].   4. PAH (pulmonary artery hypertension) (HCC) Referred to cardiology for further evaluation.  - Ambulatory referral to Cardiology  5. Primary hypertension Significantly elevated blood pressure today, instructed patient to  discuss this with his PCP whom he is going to see for an appt immediately after this office visit today.  6. SOB (shortness of breath) On continuous supplemental oxygen.    General Counseling: Timohty verbalizes understanding of the findings of todays visit and agrees with plan of treatment. I have discussed any further diagnostic evaluation that may be needed or ordered today. We also reviewed his medications today. he has been encouraged to call the office with any questions or concerns that should arise related to todays visit.    Orders Placed This Encounter  Procedures   Ambulatory referral to Cardiology    No orders of the defined types were placed in this encounter.   Return in about 26 weeks (around 04/11/2024) for F/U, pulmonary/sleep, Kule Gascoigne PCP.   Total time spent:30 Minutes Time spent includes review of chart, medications, test results, and follow up plan with the patient.   [place] Controlled Substance Database was reviewed by me.  This patient was seen by Sallyanne Kuster, FNP-C in collaboration with Dr. Beverely Risen as a part of collaborative care agreement.   Ela Moffat R. Tedd Sias, MSN, FNP-C Internal medicine

## 2023-10-13 ENCOUNTER — Encounter (INDEPENDENT_AMBULATORY_CARE_PROVIDER_SITE_OTHER): Payer: Self-pay | Admitting: Nurse Practitioner

## 2023-10-13 ENCOUNTER — Telehealth: Payer: Self-pay | Admitting: Nurse Practitioner

## 2023-10-13 ENCOUNTER — Encounter: Payer: Self-pay | Admitting: Oncology

## 2023-10-13 ENCOUNTER — Ambulatory Visit (INDEPENDENT_AMBULATORY_CARE_PROVIDER_SITE_OTHER): Payer: Medicare Other | Admitting: Nurse Practitioner

## 2023-10-13 VITALS — BP 190/86 | HR 68 | Resp 18 | Wt >= 6400 oz

## 2023-10-13 DIAGNOSIS — I10 Essential (primary) hypertension: Secondary | ICD-10-CM

## 2023-10-13 DIAGNOSIS — I890 Lymphedema, not elsewhere classified: Secondary | ICD-10-CM

## 2023-10-13 NOTE — Progress Notes (Signed)
Subjective:    Patient ID: [name] Walker, male    DOB: 08-16-1976, 48 y.o.   MRN: [id number] Chief Complaint  Patient presents with   Follow-up    37yr follow up    The patient returns to the office for followup evaluation regarding leg swelling.  The swelling has improved quite a bit and the pain associated with swelling has decreased substantially. There have not been any interval development of a ulcerations or wounds.  He notes that since the previous visit he is primarily sedentary and elevates his lower extremities.  He notes that if he goes for prolonged periods and with dependency he does have some extensive swelling.    Review of Systems  Cardiovascular:  Positive for leg swelling.  All other systems reviewed and are negative.      Objective:   Physical Exam Vitals reviewed.  Constitutional:      Appearance: He is obese.  HENT:     Head: Normocephalic.  Cardiovascular:     Rate and Rhythm: Normal rate.  Pulmonary:     Effort: Pulmonary effort is normal.  Musculoskeletal:     Right lower leg: 1+ Edema present.     Left lower leg: 1+ Edema present.  Skin:    General: Skin is warm and dry.  Neurological:     Mental Status: He is alert and oriented to person, place, and time.  Psychiatric:        Mood and Affect: Mood normal.        Behavior: Behavior normal.        Thought Content: Thought content normal.        Judgment: Judgment normal.     BP (!) 190/86   Pulse 68   Resp 18   Wt (!) 452 lb 6.4 oz (205.2 kg)   BMI 73.02 kg/m   Past Medical History:  Diagnosis Date   Hypertension    Hypoxia    Sleep apnea     Social History   Socioeconomic History   Marital status: Single    Spouse name: Not on file   Number of children: Not on file   Years of education: Not on file   Highest education level: Not on file  Occupational History   Not on file  Tobacco Use   Smoking status: Never   Smokeless tobacco: Never  Substance and Sexual Activity    Alcohol use: Yes    Alcohol/week: 24.0 standard drinks of alcohol    Types: 24 Cans of beer per week    Comment: per week   Drug use: Not Currently   Sexual activity: Not on file  Other Topics Concern   Not on file  Social History Narrative   Not on file   Social Drivers of Health   Financial Resource Strain: Low Risk  (04/06/2023)   Received from [facility]   Overall Financial Resource Strain (CARDIA)    Difficulty of Paying Living Expenses: Not hard at all  Food Insecurity: No Food Insecurity (04/06/2023)   Received from [facility]   Hunger Vital Sign    Worried About Running Out of Food in the Last Year: Never true    Ran Out of Food in the Last Year: Never true  Transportation Needs: No Transportation Needs (04/06/2023)   Received from [facility] - Transportation    In the past 12 months, has lack of transportation kept you from medical appointments or  from getting medications?: No    Lack of Transportation (Non-Medical): No  Physical Activity: Not on file  Stress: Not on file  Social Connections: Unknown (01/28/2022)   Received from [facility]   Social Network    Social Network: Not on file  Intimate Partner Violence: Unknown (12/20/2021)   Received from [facility], [facility]   HITS    Physically Hurt: Not on file    Insult or Talk Down To: Not on file    Threaten Physical Harm: Not on file    Scream or Curse: Not on file    Past Surgical History:  Procedure Laterality Date   HERNIA REPAIR     KNEE SURGERY      Family History  Problem Relation Age of Onset   Alzheimer's disease Mother    Heart disease Father    Hypertension Father    Arthritis Father    Cancer Paternal Grandmother     No Known Allergies     Latest Ref Rng & Units 10/19/2014   11:43 AM 05/29/2014    9:00 AM  CBC  WBC 3.8 - 10.6 x10 3/mm 9.5  10.0   Hemoglobin 13.0 - 18.0 g/dL 16.1  09.6   Hematocrit  40.0 - 52.0 % 50.6  38.4   Platelets 150 - 440 x10 3/mm 201  202       CMP     Component Value Date/Time   NA 139 05/29/2014 0900   K 4.0 05/29/2014 0900   CL 103 05/29/2014 0900   CO2 27 05/29/2014 0900   GLUCOSE 109 (H) 05/29/2014 0900   BUN 11 05/29/2014 0900   CREATININE 0.83 05/29/2014 0900   CALCIUM 8.8 05/29/2014 0900   PROT 8.2 05/29/2014 0900   ALBUMIN 3.0 (L) 05/29/2014 0900   AST 37 05/29/2014 0900   ALT 54 05/29/2014 0900   ALKPHOS 71 05/29/2014 0900   BILITOT 0.3 05/29/2014 0900   GFRNONAA >60 05/29/2014 0900     No results found.     Assessment & Plan:   1. Lymphedema (Primary) Today the patient's lower extremity edema is fairly well-controlled.  This is largely because he elevates his legs for majority of the day.  However he has not fairly consistent with medical grade compression.  We discussed the use of compression with lymphedema and that utilizing this also helps to improve overall control of lymphedema.  In addition the patient is recommended to utilize his lymphedema pump.  We will plan on seeing him again in 1 year as long as no issues have arisen.  2. Primary hypertension Continue antihypertensive medications as already ordered, these medications have been reviewed and there are no changes at this time.  His blood pressure is elevated today but this is not his baseline.  He is advised to continue with monitoring and if it continues to be elevated to notify his PCP  3. Morbid obesity (HCC) This significantly contributes to the patient's lower extremity edema   Current Outpatient Medications on File Prior to Visit  Medication Sig Dispense Refill   furosemide (LASIX) 40 MG tablet 40 mg as needed.     gabapentin (NEURONTIN) 100 MG capsule Take 100 mg by mouth 2 (two) times daily.     hydrALAZINE (APRESOLINE) 25 MG tablet Take 1 tablet by mouth 2 (two) times daily.     metoprolol succinate (TOPROL-XL) 50 MG 24 hr tablet Take by mouth.     OXYGEN  Inhale into the lungs.  potassium chloride (K-DUR) 10 MEQ tablet Take 10 mEq by mouth as needed.     valsartan (DIOVAN) 320 MG tablet Take 320 mg by mouth daily.     gabapentin (NEURONTIN) 100 MG capsule Take 1 capsule by mouth 2 (two) times daily.     No current facility-administered medications on file prior to visit.    There are no Patient Instructions on file for this visit. No follow-ups on file.   Georgiana Spinner, NP

## 2023-10-13 NOTE — Telephone Encounter (Signed)
[place] progress report completed & scanned. Notified patient ready to p/u @ front desk-Toni

## 2023-10-19 ENCOUNTER — Ambulatory Visit (INDEPENDENT_AMBULATORY_CARE_PROVIDER_SITE_OTHER): Payer: 59 | Admitting: Nurse Practitioner

## 2023-10-21 ENCOUNTER — Telehealth (INDEPENDENT_AMBULATORY_CARE_PROVIDER_SITE_OTHER): Payer: Self-pay | Admitting: Nurse Practitioner

## 2023-10-21 NOTE — Telephone Encounter (Signed)
 Patient called checking on FMLA disability paperwork. Stated he brought it to his last visit 10/13/23. Please call patient when ready for pickup.

## 2023-10-27 NOTE — Telephone Encounter (Signed)
Paper work completed.

## 2023-10-27 NOTE — Telephone Encounter (Signed)
Called patient to come and pick them. Leaving them in the front with Rebekah

## 2023-11-02 ENCOUNTER — Ambulatory Visit: Payer: Medicare Other | Admitting: Nurse Practitioner

## 2023-12-27 NOTE — Progress Notes (Unsigned)
 Cardiology Office Note  Date:  12/28/2023   ID:  [name] Walker, DOB 09-Jul-1976, MRN [id number]  PCP:  Melchor Spoon, MD   Chief Complaint  Patient presents with   New Patient (Initial Visit)    Ref by Laurence Pons, NP pulmonary artery hypertension.  Patient c/o fatigue, shortness of breath with over exertion & bilateral LE edema.     HPI:  [name] Walker is a 48 year old gentleman with past medical history of Lymphedema Hypertension Sleep apnea on CPAP Morbid obesity, chronic hypoxic respiratory failure Coronary artery disease on CT scan Who presents by referral from Laurence Pons for consultation of suspected pulmonary hypertension  Long discussion concerning prior cardiac and pulmonary history Been on oxygen since 2022 Reports having workup for chronic respiratory failure in 2022 with echocardiogram, CT scan chest Echo unrevealing, CT scan chest with dilation of pulmonary artery and concern for pulmonary hypertension  He reports having chronic hypoxia when dating Chronically on 3 liters nasal cannula oxygen Breathing the same over past few days Cpap at night Feels like symptoms are stable, not progressing Rarely takes Lasix for leg swelling, less than once per month  Imaging reviewed Grossly normal echocardiogram August 2022, outside facility CT scan August 2022, enlarged pulmonary artery  For his periodic leg swelling he takes Lasix, Swelling better with leg elevation Sleeps in recliner Fatigue in legs with walking Paces himself with walking  Lab work reviewed Total cholesterol 189 LDL 111 A1c 6.1  EKG personally reviewed by myself on todays visit EKG Interpretation Date/Time:  Monday December 28 2023 13:56:04 EDT Ventricular Rate:  87 PR Interval:  126 QRS Duration:  92 QT Interval:  384 QTC Calculation: 462 R Axis:   72  Text Interpretation: Normal sinus rhythm Normal ECG No previous ECGs available Confirmed by Belva Boyden [phone]) on 12/28/2023  2:08:32 PM    PMH:   has a past medical history of Hypertension, Hypoxia, and Sleep apnea.  PSH:    Past Surgical History:  Procedure Laterality Date   HERNIA REPAIR     KNEE SURGERY      Current Outpatient Medications  Medication Sig Dispense Refill   furosemide (LASIX) 40 MG tablet 40 mg as needed.     gabapentin (NEURONTIN) 100 MG capsule Take 100 mg by mouth 2 (two) times daily.     hydrALAZINE (APRESOLINE) 25 MG tablet Take 1 tablet by mouth 2 (two) times daily.     metoprolol succinate (TOPROL-XL) 50 MG 24 hr tablet Take 50 mg by mouth daily.     OXYGEN Inhale 3 L into the lungs daily.     potassium chloride (K-DUR) 10 MEQ tablet Take 10 mEq by mouth as needed.     Testosterone 1.62 % GEL Apply topically.     valsartan (DIOVAN) 320 MG tablet Take 320 mg by mouth daily.     No current facility-administered medications for this visit.    Allergies:   Patient has no known allergies.   Social History:  The patient  reports that he has never smoked. He has never used smokeless tobacco. He reports current alcohol use of about 24.0 standard drinks of alcohol per week. He reports that he does not currently use drugs.   Family History:   family history includes Alzheimer's disease in his mother; Arthritis in his father; Cancer in his paternal grandmother; Heart disease in his father; Hypertension in his father.    Review of Systems: Review of Systems  Constitutional: Negative.  HENT: Negative.    Respiratory:  Positive for shortness of breath.   Cardiovascular: Negative.   Gastrointestinal: Negative.   Musculoskeletal: Negative.   Neurological: Negative.   Psychiatric/Behavioral: Negative.    All other systems reviewed and are negative.  PHYSICAL EXAM: VS:  Ht 5\' 6"  (1.676 m)   Wt (!) 458 lb (207.7 kg)   BMI 73.92 kg/m  , BMI Body mass index is 73.92 kg/m. GEN: Well nourished, well developed, in no acute, morbidly obese distress HEENT: normal Neck: no JVD, carotid  bruits, or masses Cardiac: RRR; no murmurs, rubs, or gallops,no edema  Respiratory:  clear to auscultation bilaterally, normal work of breathing GI: soft, nontender, nondistended, + BS MS: no deformity or atrophy Skin: warm and dry, no rash Neuro:  Strength and sensation are intact Psych: euthymic mood, full affect  Recent Labs: No results found for requested labs within last 365 days.    Lipid Panel No results found for: "CHOL", "HDL", "LDLCALC", "TRIG"    Wt Readings from Last 3 Encounters:  12/28/23 (!) 458 lb (207.7 kg)  10/13/23 (!) 452 lb 6.4 oz (205.2 kg)  10/12/23 (!) 460 lb 6.4 oz (208.8 kg)       ASSESSMENT AND PLAN:  Problem List Items Addressed This Visit       Cardiology Problems   Hyperlipidemia   Hypertension - Primary   Relevant Orders   EKG 12-Lead     Other   Lymphedema   Sleep apnea   Morbid obesity Contributing to many of his medical issues including chronic hypoxic respiratory failure, leg weakness, debility Reports he is unable to afford GLP-1 medication as they are too expensive and he does not have diabetes  Essential hypertension Blood pressure elevated on initial check after long walk into the office Mild improvement on recheck though still mildly elevated Recommend he monitor blood pressure at home No changes to medications on today's visit  Chronic respiratory failure In the setting of severe morbid obesity, likely obesity hypoventilation, sleep apnea Reports he is on CPAP, uses 3 L nasal cannula oxygen chronically Prior echocardiogram 2022 report relatively incomplete Repeat echocardiogram ordered to assess right heart pressures if possible though images may be limited secondary to body habitus -Treatment of his pulmonary hypertension would include weight loss, regularly scheduled Lasix - Clinically with no significant leg edema, appears euvolemic He prefers no right heart cath if possible  Leg swelling Minimal leg swelling on  today's visit, takes Lasix less than once a month for worsening leg swelling, blistering Typically leg swelling improves with leg elevation  Patient seen in consultation for Alyssa Abernathy and will be referred back to her office for ongoing care of the issues detailed above  Signed, Juanda Noon, M.D., Ph.D. [facility] [place], [place] [phone]

## 2023-12-28 ENCOUNTER — Ambulatory Visit: Payer: Medicare Other | Attending: Cardiovascular Disease | Admitting: Cardiovascular Disease

## 2023-12-28 ENCOUNTER — Encounter: Payer: Self-pay | Admitting: Cardiovascular Disease

## 2023-12-28 VITALS — BP 150/80 | HR 87 | Ht 66.0 in | Wt >= 6400 oz

## 2023-12-28 DIAGNOSIS — I272 Pulmonary hypertension, unspecified: Secondary | ICD-10-CM

## 2023-12-28 DIAGNOSIS — E782 Mixed hyperlipidemia: Secondary | ICD-10-CM

## 2023-12-28 DIAGNOSIS — I890 Lymphedema, not elsewhere classified: Secondary | ICD-10-CM

## 2023-12-28 DIAGNOSIS — G4730 Sleep apnea, unspecified: Secondary | ICD-10-CM | POA: Diagnosis not present

## 2023-12-28 DIAGNOSIS — I10 Essential (primary) hypertension: Secondary | ICD-10-CM | POA: Diagnosis not present

## 2023-12-28 NOTE — Patient Instructions (Addendum)

## 2024-01-25 ENCOUNTER — Ambulatory Visit: Attending: Cardiovascular Disease

## 2024-01-25 DIAGNOSIS — I272 Pulmonary hypertension, unspecified: Secondary | ICD-10-CM | POA: Diagnosis not present

## 2024-01-25 LAB — ECHOCARDIOGRAM COMPLETE: Area-P 1/2: 3.08 cm2

## 2024-01-25 MED ORDER — PERFLUTREN LIPID MICROSPHERE
1.0000 mL | INTRAVENOUS | Status: AC | PRN
  Administered 2024-01-25: 3 mL via INTRAVENOUS

## 2024-01-26 ENCOUNTER — Ambulatory Visit: Payer: Self-pay | Admitting: Cardiovascular Disease

## 2024-01-26 ENCOUNTER — Encounter: Payer: Self-pay | Admitting: Emergency Medicine

## 2024-02-02 ENCOUNTER — Encounter (INDEPENDENT_AMBULATORY_CARE_PROVIDER_SITE_OTHER): Payer: Self-pay

## 2024-04-11 ENCOUNTER — Ambulatory Visit: Payer: Medicare Other | Admitting: Nurse Practitioner

## 2024-04-14 ENCOUNTER — Telehealth: Payer: Self-pay | Admitting: Internal Medicine

## 2024-04-14 NOTE — Telephone Encounter (Signed)
 Lvm to move 04/25/24 pulmonary appointment to [facility]-Toni

## 2024-04-25 ENCOUNTER — Ambulatory Visit: Admitting: Nurse Practitioner

## 2024-05-09 ENCOUNTER — Ambulatory Visit (INDEPENDENT_AMBULATORY_CARE_PROVIDER_SITE_OTHER): Admitting: Internal Medicine

## 2024-05-09 VITALS — BP 120/96 | HR 89 | Temp 98.5°F | Resp 16 | Ht 64.0 in | Wt >= 6400 oz

## 2024-05-09 DIAGNOSIS — G4733 Obstructive sleep apnea (adult) (pediatric): Secondary | ICD-10-CM | POA: Diagnosis not present

## 2024-05-09 DIAGNOSIS — J9611 Chronic respiratory failure with hypoxia: Secondary | ICD-10-CM

## 2024-05-09 DIAGNOSIS — E662 Morbid (severe) obesity with alveolar hypoventilation: Secondary | ICD-10-CM | POA: Diagnosis not present

## 2024-05-09 NOTE — Progress Notes (Signed)
 [facility] [address] [place], [place] [address]  Pulmonary Sleep Medicine   Office Visit Note  Patient Name: [name] Walker DOB: Nov 08, 1975 MRN [id number]  Date of Service: 05/09/2024  Complaints/HPI: Patient was seen 3 years ago. He is on oxygen  and states he is doing well. He did get another echo and this shows same EF as prior. He is using his PAP as prescribed. Denies missing nights and only issue was need for supplies. He has not been able to lose weight. O2 is at 3lpm 24h  Office Spirometry Results:     ROS  General: (-) fever, (-) chills, (-) night sweats, (-) weakness Skin: (-) rashes, (-) itching,. Eyes: (-) visual changes, (-) redness, (-) itching. Nose and Sinuses: (-) nasal stuffiness or itchiness, (-) postnasal drip, (-) nosebleeds, (-) sinus trouble. Mouth and Throat: (-) sore throat, (-) hoarseness. Neck: (-) swollen glands, (-) enlarged thyroid , (-) neck pain. Respiratory: - cough, (-) bloody sputum, + shortness of breath, - wheezing. Cardiovascular: - ankle swelling, (-) chest pain. Lymphatic: (-) lymph node enlargement. Neurologic: (-) numbness, (-) tingling. Psychiatric: (-) anxiety, (-) depression   Current Medication: Outpatient Encounter Medications as of 05/09/2024  Medication Sig   furosemide (LASIX) 40 MG tablet 40 mg as needed.   gabapentin (NEURONTIN) 100 MG capsule Take 100 mg by mouth 2 (two) times daily.   hydrALAZINE (APRESOLINE) 25 MG tablet Take 1 tablet by mouth 2 (two) times daily.   metoprolol succinate (TOPROL-XL) 50 MG 24 hr tablet Take 50 mg by mouth daily.   OXYGEN  Inhale 3 L into the lungs daily.   potassium chloride (K-DUR) 10 MEQ tablet Take 10 mEq by mouth as needed.   Testosterone  1.62 % GEL Apply topically.   valsartan (DIOVAN) 320 MG tablet Take 320 mg by mouth daily.   No facility-administered encounter medications on file as of 05/09/2024.    Surgical History: Past Surgical History:  Procedure Laterality  Date   HERNIA REPAIR     KNEE SURGERY      Medical History: Past Medical History:  Diagnosis Date   Hypertension    Hypoxia    Sleep apnea     Family History: Family History  Problem Relation Age of Onset   Alzheimer's disease Mother    Heart disease Father    Hypertension Father    Arthritis Father    Hyperlipidemia Brother    Hypertension Brother    Diabetes Brother    Cancer Paternal Grandmother     Social History: Social History   Socioeconomic History   Marital status: Single    Spouse name: Not on file   Number of children: Not on file   Years of education: Not on file   Highest education level: Not on file  Occupational History   Not on file  Tobacco Use   Smoking status: Never   Smokeless tobacco: Never  Vaping Use   Vaping status: Never Used  Substance and Sexual Activity   Alcohol use: Yes    Alcohol/week: 24.0 standard drinks of alcohol    Types: 24 Cans of beer per week    Comment: per week   Drug use: Not Currently   Sexual activity: Not on file  Other Topics Concern   Not on file  Social History Narrative   Not on file   Social Drivers of Health   Financial Resource Strain: Low Risk  (04/11/2024)   Received from [facility]   Overall Financial Resource  Strain (CARDIA)    Difficulty of Paying Living Expenses: Not hard at all  Food Insecurity: No Food Insecurity (04/11/2024)   Received from [facility]   Hunger Vital Sign    Within the past 12 months, you worried that your food would run out before you got the money to buy more.: Never true    Within the past 12 months, the food you bought just didn't last and you didn't have money to get more.: Never true  Transportation Needs: No Transportation Needs (04/11/2024)   Received from [facility] - Transportation    In the past 12 months, has lack of transportation kept you from medical appointments or from getting medications?: No     Lack of Transportation (Non-Medical): No  Physical Activity: Not on file  Stress: Not on file  Social Connections: Unknown (01/28/2022)   [facility]   Social Network    Social Network: Not on file  Intimate Partner Violence: Unknown (12/20/2021)   Received from [facility]   HITS    Physically Hurt: Not on file    Insult or Talk Down To: Not on file    Threaten Physical Harm: Not on file    Scream or Curse: Not on file    Vital Signs: Blood pressure (!) 120/96, pulse 89, temperature 98.5 F (36.9 C), resp. rate 16, height 5' 4 (1.626 m), weight (!) 454 lb (205.9 kg), SpO2 95%.  Examination: General Appearance: The patient is well-developed, well-nourished, and in no distress. Skin: Gross inspection of skin unremarkable. Head: normocephalic, no gross deformities. Eyes: no gross deformities noted. ENT: ears appear grossly normal no exudates. Neck: Supple. No thyromegaly. No LAD. Respiratory: no rhonchi noted. Cardiovascular: Normal S1 and S2 without murmur or rub. Extremities: No cyanosis. pulses are equal. Neurologic: Alert and oriented. No involuntary movements.  LABS: No results found for this or any previous visit (from the past 2160 hours).  Radiology: No results found.  No results found.  No results found.  Assessment and Plan: Patient Active Problem List   Diagnosis Date Noted   Chronic hypoxemic respiratory failure (HCC) 08/23/2021   B12 deficiency 11/29/2020   History of hypothyroidism 05/11/2019   Hyperlipidemia 05/11/2019   Sleep apnea 05/11/2019   Lymphedema 03/10/2019   Hypertension 03/08/2019   Morbid obesity (HCC) 03/08/2019   Lower limb ulcer, calf, right, limited to breakdown of skin (HCC) 03/08/2019   Swelling of limb 03/08/2019   Polycythemia 01/26/2015   Cellulitis 04/12/2013   Morbid obesity with BMI of 40.0-44.9, adult (HCC) 04/12/2013   Foreign body anus/rectum 04/05/2013   Encounter for long-term (current) use of  medications 11/30/2012   Hypogonadism in male 11/30/2012    1. OSA (obstructive sleep apnea) (Primary) On cpap therapy and will continue with current treatment as prescribed  2. Obesity hypoventilation syndrome (HCC) Work on weight loss. He is starting manjaro  3. Chronic hypoxemic respiratory failure (HCC) On oxygen  therapy   4. CHF last EF normal grade 1 diastolic dysfunction  General Counseling: I have discussed the findings of the evaluation and examination with Jaydis.  I have also discussed any further diagnostic evaluation thatmay be needed or ordered today. Ladarion verbalizes understanding of the findings of todays visit. We also reviewed his medications today and discussed drug interactions and side effects including but not limited excessive drowsiness and altered mental states. We also discussed that there is always a risk not just to him but also  people around him. he has been encouraged to call the office with any questions or concerns that should arise related to todays visit.  No orders of the defined types were placed in this encounter.    Time spent: 58  I have personally obtained a history, examined the patient, evaluated laboratory and imaging results, formulated the assessment and plan and placed orders.    Elfreda DELENA Bathe, MD [facility] [place] Pulmonary and Critical Care Sleep medicine

## 2024-05-11 ENCOUNTER — Telehealth: Payer: Self-pay

## 2024-05-11 NOTE — Telephone Encounter (Signed)
 LVM for patient regarding disability forms.

## 2024-05-11 NOTE — Telephone Encounter (Signed)
 Gave patient's [employer] disability paperwork to [employer] to fill out.

## 2024-05-17 ENCOUNTER — Telehealth: Payer: Self-pay | Admitting: Nurse Practitioner

## 2024-05-17 NOTE — Telephone Encounter (Signed)
 Disability form completed. Faxed to [place]; [phone]. Scanned-Toni

## 2024-10-04 ENCOUNTER — Encounter: Payer: Self-pay | Admitting: Oncology

## 2024-10-06 ENCOUNTER — Telehealth (INDEPENDENT_AMBULATORY_CARE_PROVIDER_SITE_OTHER): Payer: Self-pay

## 2024-10-06 NOTE — Telephone Encounter (Signed)
 Patient called at this time requesting to cancel his appointment for Tuesday 10/11/24 @ 215 due to weather, and he would like to reschedule for February 10 per request. Please contact patient and reschedule.

## 2024-10-11 ENCOUNTER — Ambulatory Visit (INDEPENDENT_AMBULATORY_CARE_PROVIDER_SITE_OTHER): Payer: Medicare Other | Admitting: Vascular Surgery

## 2024-10-25 ENCOUNTER — Ambulatory Visit (INDEPENDENT_AMBULATORY_CARE_PROVIDER_SITE_OTHER): Admitting: Vascular Surgery

## 2024-11-08 ENCOUNTER — Ambulatory Visit (INDEPENDENT_AMBULATORY_CARE_PROVIDER_SITE_OTHER): Admitting: Vascular Surgery

## 2025-05-16 ENCOUNTER — Ambulatory Visit: Admitting: Internal Medicine
